# Patient Record
Sex: Female | Born: 1990 | Race: White | Hispanic: Yes | Smoking: Never smoker
Health system: Southern US, Community
[De-identification: ages and names within clinical notes are randomized; demographics above are authoritative.]

## PROBLEM LIST (undated history)

## (undated) DIAGNOSIS — Z789 Other specified health status: Secondary | ICD-10-CM

## (undated) HISTORY — DX: Other specified health status: Z78.9

## (undated) HISTORY — PX: GALLBLADDER SURGERY: SHX652

## (undated) HISTORY — PX: ANKLE SURGERY: SHX546

---

## 2008-10-20 ENCOUNTER — Emergency Department: Payer: Self-pay | Admitting: Emergency Medicine

## 2010-10-03 IMAGING — CT CT ABD-PELV W/ CM
1 of 2 series · 16 of 32 positions shown, 20 images · non-contrast
Comparison: none

REASON FOR EXAM: (1) mid abd pain; (2) same
COMMENTS:

[Series 2: appendicitis · axial · 0.68mm/px · z∈[-307,+119]mm · 16 of 156 slices shown, 20 images]
[im 7/156  soft-tissue]
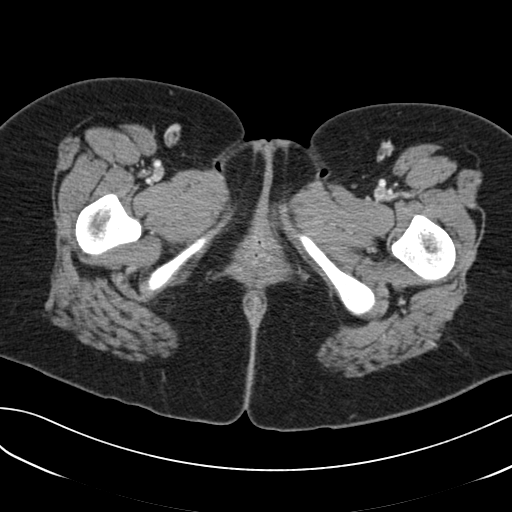
[im 7/156  bone]
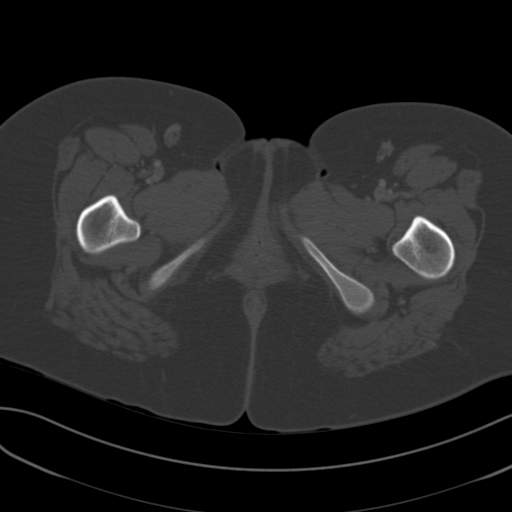
[im 19/156  soft-tissue]
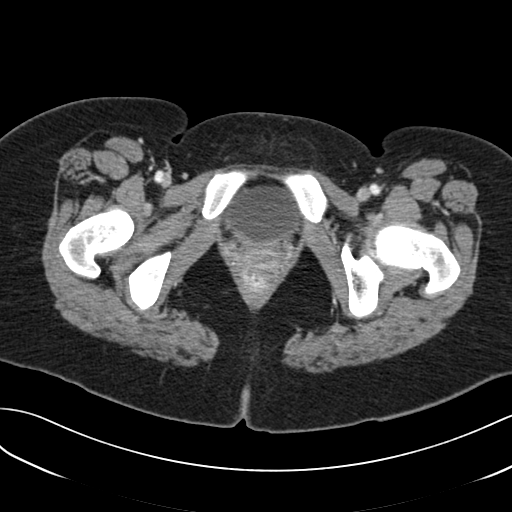
[im 32/156  soft-tissue]
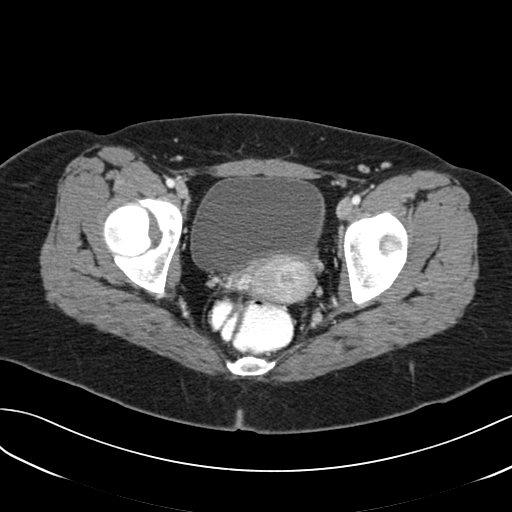
[im 44/156  soft-tissue]
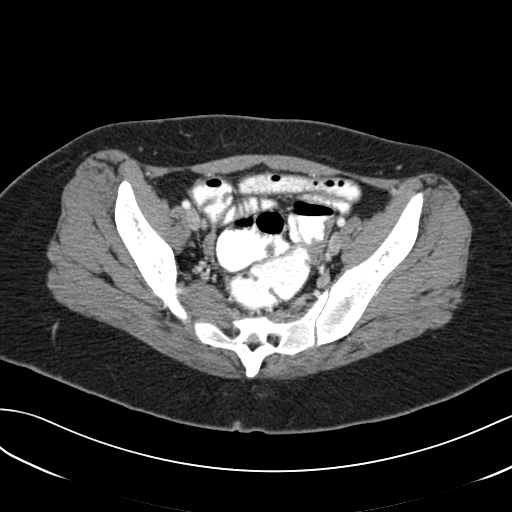
[im 50/156  soft-tissue]
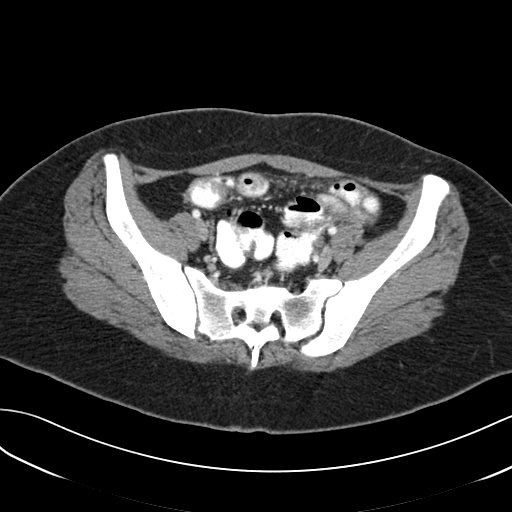
[im 63/156  soft-tissue]
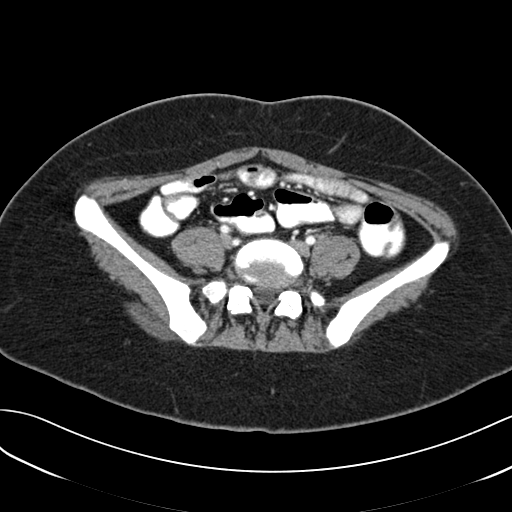
[im 75/156  soft-tissue]
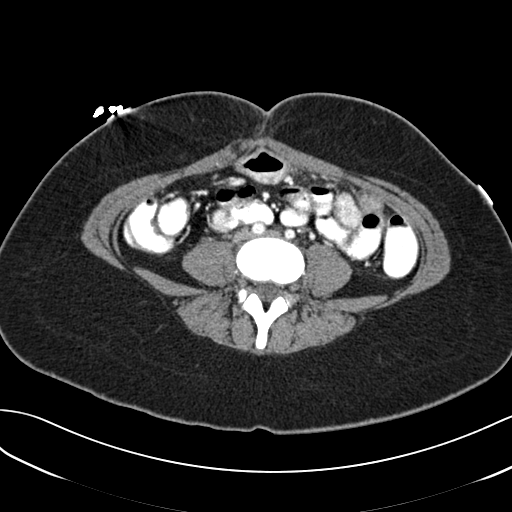
[im 81/156  soft-tissue]
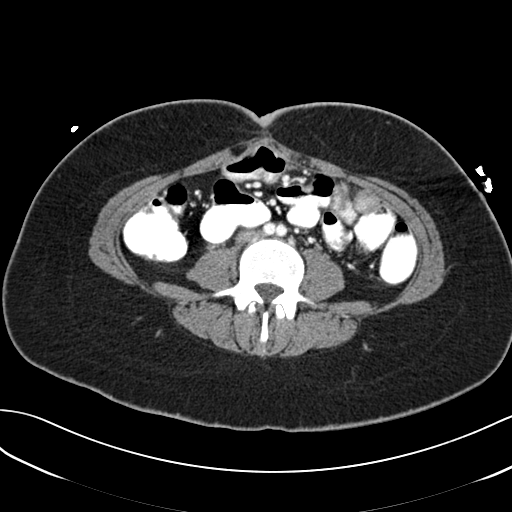
[im 94/156  soft-tissue]
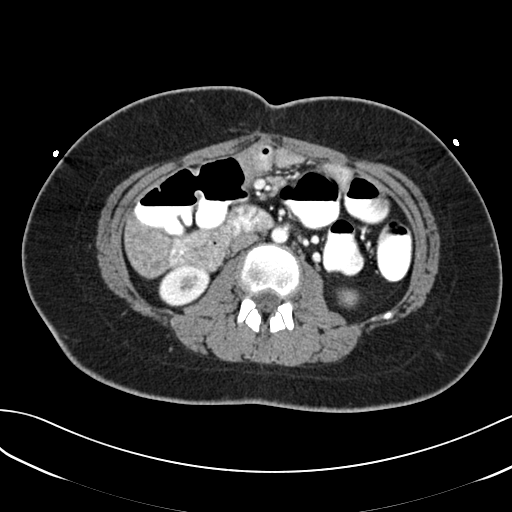
[im 94/156  bone]
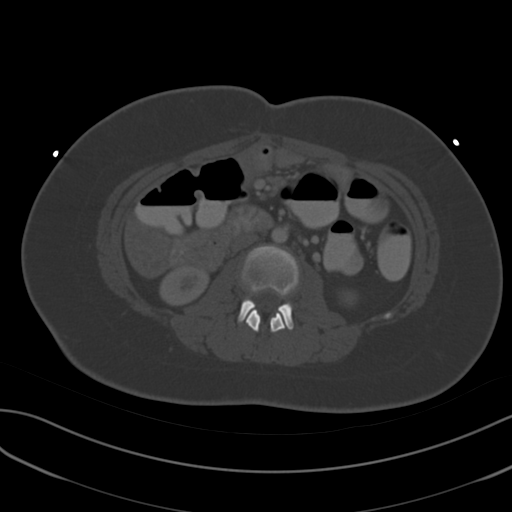
[im 106/156  soft-tissue]
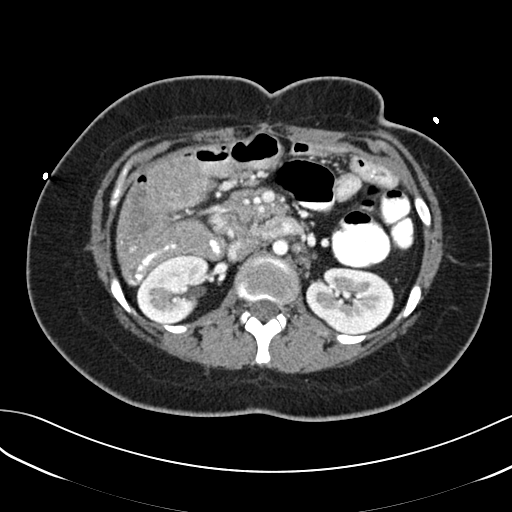
[im 118/156  soft-tissue]
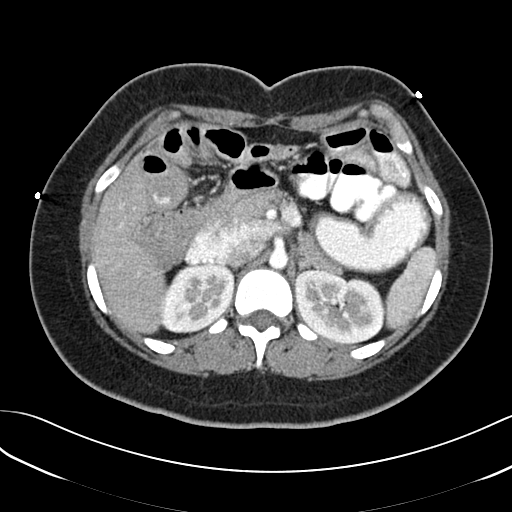
[im 125/156  soft-tissue]
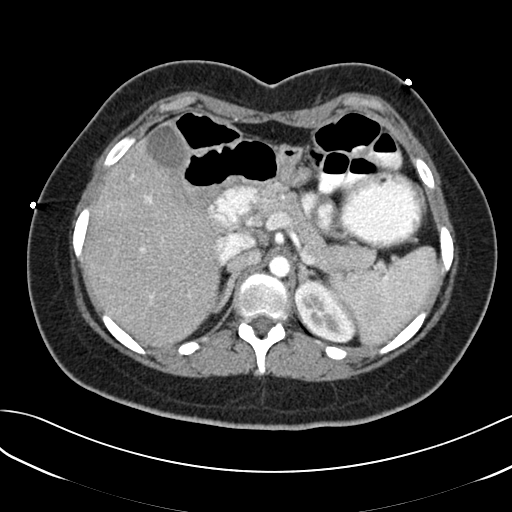
[im 131/156  lung]
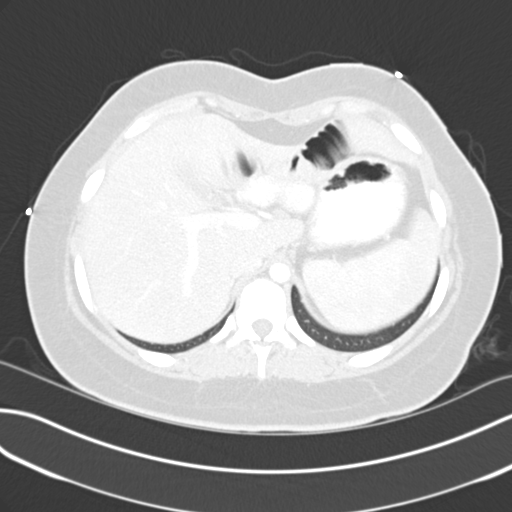
[im 137/156  soft-tissue]
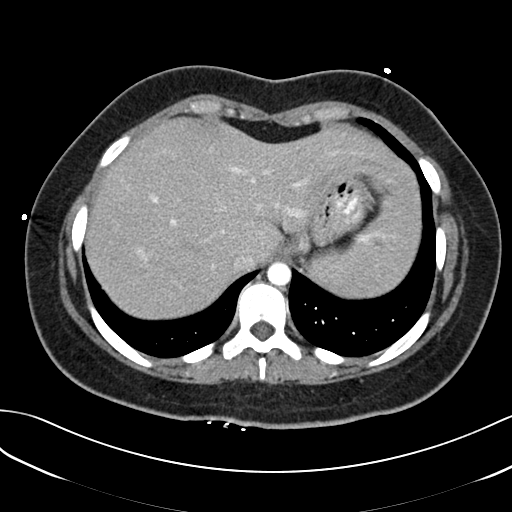
[im 137/156  lung]
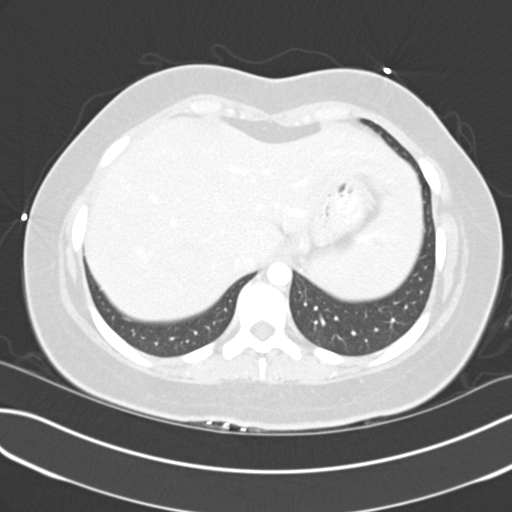
[im 143/156  lung]
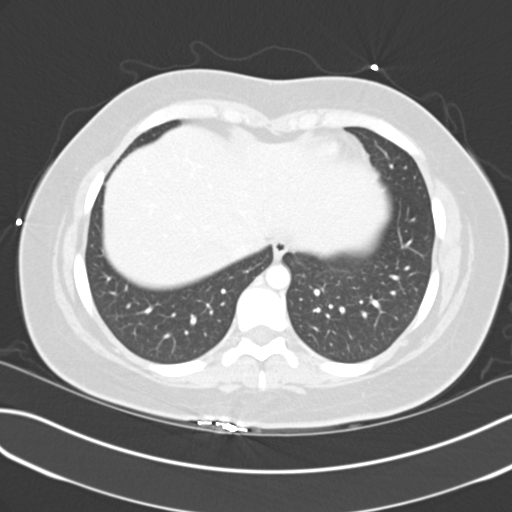
[im 149/156  soft-tissue]
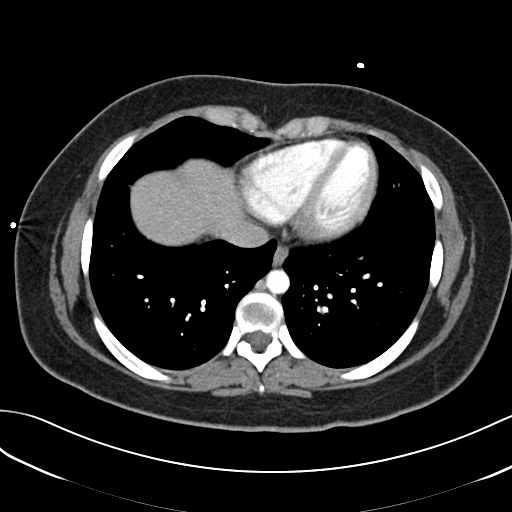
[im 149/156  lung]
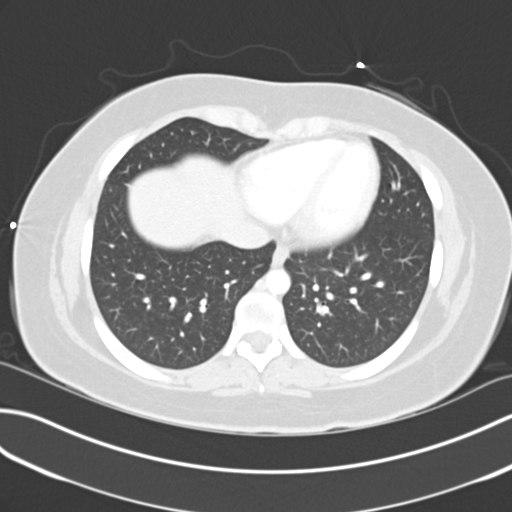

[16 of 32 positions shown; findings below may reference images not displayed]

PROCEDURE:     CT  - CT ABDOMEN / PELVIS  W  - October 20, 2008 [DATE]

RESULT:     History: Abdominal pain.

COMPARISON STUDIES:  No prior.

PROCEDURE AND FINDINGS:  Following administration of 100 ml of Bsovue-LI0,
CT of the abdomen and pelvis is obtained.

The liver is normal. The spleen is normal. The pancreas is normal. The
adrenals are normal. The kidneys are normal. Small bowel loops are noted to
the right of the ascending colon. An internal hernia could present in this
fashion. No proximal bowel distention is noted. The colon is well distended.
The aorta is normal. Mesenteric vasculature is normal. The right lower
quadrant is unremarkable. There is no free air.
IMPRESSION: Small bowel loops are noted in the right upper quadrant
lateral to the ascending colon. An internal hernia can present in this
fashion. No bowel obstruction or acute abnormality.

## 2020-05-25 ENCOUNTER — Ambulatory Visit: Payer: Self-pay

## 2020-10-18 ENCOUNTER — Ambulatory Visit: Payer: Medicaid Other

## 2020-10-19 ENCOUNTER — Ambulatory Visit: Payer: Medicaid Other

## 2020-12-28 ENCOUNTER — Ambulatory Visit: Payer: Medicaid Other | Admitting: Nurse Practitioner

## 2020-12-28 ENCOUNTER — Other Ambulatory Visit: Payer: Self-pay

## 2020-12-28 ENCOUNTER — Encounter: Payer: Self-pay | Admitting: Nurse Practitioner

## 2020-12-28 VITALS — BP 122/76 | HR 66 | Temp 98.3°F | Ht 64.0 in | Wt 207.0 lb

## 2020-12-28 DIAGNOSIS — Z13220 Encounter for screening for lipoid disorders: Secondary | ICD-10-CM | POA: Diagnosis not present

## 2020-12-28 DIAGNOSIS — L659 Nonscarring hair loss, unspecified: Secondary | ICD-10-CM | POA: Diagnosis not present

## 2020-12-28 DIAGNOSIS — Z7689 Persons encountering health services in other specified circumstances: Secondary | ICD-10-CM

## 2020-12-28 DIAGNOSIS — R5383 Other fatigue: Secondary | ICD-10-CM

## 2020-12-28 DIAGNOSIS — R4589 Other symptoms and signs involving emotional state: Secondary | ICD-10-CM

## 2020-12-28 DIAGNOSIS — Z136 Encounter for screening for cardiovascular disorders: Secondary | ICD-10-CM

## 2020-12-28 DIAGNOSIS — Z3009 Encounter for other general counseling and advice on contraception: Secondary | ICD-10-CM

## 2020-12-28 DIAGNOSIS — G4700 Insomnia, unspecified: Secondary | ICD-10-CM

## 2020-12-28 NOTE — Patient Instructions (Signed)
Can take melatonin 3-5mg  OTC 30-45 minutes before bed to help you sleep

## 2020-12-28 NOTE — Progress Notes (Signed)
New Patient Office Visit  Subjective:  Patient ID: Kelly Hester, female    DOB: 11-29-1990  Age: 30 y.o. MRN: [id number]  CC:  Chief Complaint  Patient presents with   Establish Care    Not sleeping and losing hair. She noticed about a month ago that her hair was starting to fall out, and hasn't been sleeping for 2 weeks.    HPI Kelly Hester presents for new patient visit to establish care.  Introduced to Publishing rights manager role and practice setting.  All questions answered.  Discussed provider/patient relationship and expectations. The visit was completed with use of an ASL interpreter.   She states that for the past 2 weeks she has been having trouble falling asleep. Some nights she can't fall asleep until 4am. She denies drinking any caffeine and has not tried anything over the counter. She states that her she has been feeling somewhat depressed the past few days since she wasn't able to see her children on Friday. She has 5 children who are in foster care and she is able to visit them every 2 weeks. She also has noticed that her hair has been starting to fall out over the past month. She does not take any medications or OTC supplements . Depression screen PHQ 2/9 12/28/2020  Decreased Interest 2  Down, Depressed, Hopeless 1  PHQ - 2 Score 3  Altered sleeping 2  Tired, decreased energy 2  Change in appetite 1  Feeling bad or failure about yourself  1  Trouble concentrating 0  Moving slowly or fidgety/restless 1  Suicidal thoughts 0  PHQ-9 Score 10  Difficult doing work/chores Not difficult at all   GAD 7 : Generalized Anxiety Score 12/28/2020  Nervous, Anxious, on Edge 1  Control/stop worrying 1  Worry too much - different things 1  Trouble relaxing 1  Restless 1  Easily annoyed or irritable 1  Afraid - awful might happen 2  Total GAD 7 Score 8  Anxiety Difficulty Not difficult at all    History reviewed. No pertinent past medical history.  Past Surgical  History:  Procedure Laterality Date   ANKLE SURGERY Left     History reviewed. No pertinent family history.  Social History   Socioeconomic History   Marital status: Divorced    Spouse name: Not on file   Number of children: Not on file   Years of education: Not on file   Highest education level: Not on file  Occupational History   Not on file  Tobacco Use   Smoking status: Never   Smokeless tobacco: Never  Vaping Use   Vaping Use: Every day  Substance and Sexual Activity   Alcohol use: Never   Drug use: Never   Sexual activity: Yes  Other Topics Concern   Not on file  Social History Narrative   Not on file   Social Determinants of Health   Financial Resource Strain: Not on file  Food Insecurity: Not on file  Transportation Needs: Not on file  Physical Activity: Not on file  Stress: Not on file  Social Connections: Not on file  Intimate Partner Violence: Not on file    ROS Review of Systems  Constitutional:  Positive for fatigue.  HENT:  Negative for congestion, ear pain and sore throat.        Deaf bilaterally  Eyes: Negative.   Respiratory: Negative.    Cardiovascular: Negative.   Gastrointestinal: Negative.   Genitourinary: Negative.   Musculoskeletal: Negative.  Skin: Negative.   Neurological: Negative.   Psychiatric/Behavioral:  Positive for dysphoric mood and sleep disturbance.    Objective:   Today's Vitals: BP 122/76   Pulse 66   Temp 98.3 F (36.8 C) (Oral)   Ht 5\' 4"  (1.626 m)   Wt 207 lb (93.9 kg)   LMP 11/22/2020 (Approximate)   SpO2 98%   BMI 35.53 kg/m   Physical Exam Vitals and nursing note reviewed.  Constitutional:      General: She is not in acute distress.    Appearance: Normal appearance.  HENT:     Head: Normocephalic.     Ears:     Comments: Deaf bilaterally Eyes:     Conjunctiva/sclera: Conjunctivae normal.  Cardiovascular:     Rate and Rhythm: Normal rate and regular rhythm.     Pulses: Normal pulses.      Heart sounds: Normal heart sounds.  Pulmonary:     Effort: Pulmonary effort is normal.     Breath sounds: Normal breath sounds.  Musculoskeletal:     Cervical back: Normal range of motion.  Skin:    General: Skin is warm.  Neurological:     General: No focal deficit present.     Mental Status: She is alert and oriented to person, place, and time.  Psychiatric:        Mood and Affect: Mood normal.        Behavior: Behavior normal.        Thought Content: Thought content normal.        Judgment: Judgment normal.    Assessment & Plan:   Problem List Items Addressed This Visit   None Visit Diagnoses     Fatigue, unspecified type    -  Primary   Discussed sleep hygiene. Will check CMP, CBC, and TSH today. If labs unrevealing, can consider sleep study   Relevant Orders   Comprehensive metabolic panel   CBC with Differential/Platelet   TSH   Hair loss       New x1 month. Will check labs as mentioned above   Relevant Orders   Comprehensive metabolic panel   CBC with Differential/Platelet   TSH   Encounter for lipid screening for cardiovascular disease       Baseline lipid panel today   Relevant Orders   Lipid Panel w/o Chol/HDL Ratio   Birth control counseling       Discussed birth control. She wants to have a "natural period." Non-hormonal options discussed. She would like GYN referral for Copper IUD   Relevant Orders   Ambulatory referral to Gynecology   Insomnia, unspecified type       Ongoing x2 weeks. Discussed good sleep hygiene. Can try melatonin OTC to see if it helps with symptoms. F/U 1-2 months   Encounter to establish care       Depressed mood       Symptoms for the last few days. She can exercise, improve her sleep. Will see if symptoms are still ongoing. F/U in 4-6 weeks or sooner if needed.        No outpatient encounter medications on file as of 12/28/2020.   No facility-administered encounter medications on file as of 12/28/2020.    Follow-up: Return  in about 4 weeks (around 01/25/2021) for 1-2 months for physical with pap not fasting.   01/27/2021, NP

## 2020-12-29 LAB — LIPID PANEL W/O CHOL/HDL RATIO
Cholesterol, Total: 153 mg/dL (ref 100–199)
HDL: 36 mg/dL — ABNORMAL LOW (ref >39)
LDL Chol Calc (NIH): 95 mg/dL (ref 0–99)
Triglycerides: 124 mg/dL (ref 0–149)
VLDL Cholesterol Cal: 22 mg/dL (ref 5–40)

## 2020-12-29 LAB — COMPREHENSIVE METABOLIC PANEL
ALT: 13 IU/L (ref 0–32)
AST: 15 IU/L (ref 0–40)
Albumin/Globulin Ratio: 1.6 (ref 1.2–2.2)
Albumin: 4.6 g/dL (ref 3.8–4.8)
Alkaline Phosphatase: 104 IU/L (ref 44–121)
BUN/Creatinine Ratio: 18 (ref 9–23)
BUN: 11 mg/dL (ref 6–20)
Bilirubin Total: 0.4 mg/dL (ref 0.0–1.2)
CO2: 24 mmol/L (ref 20–29)
Calcium: 9.5 mg/dL (ref 8.7–10.2)
Chloride: 103 mmol/L (ref 96–106)
Creatinine, Ser: 0.62 mg/dL (ref 0.57–1.00)
Globulin, Total: 2.8 g/dL (ref 1.5–4.5)
Glucose: 85 mg/dL (ref 70–99)
Potassium: 4.1 mmol/L (ref 3.5–5.2)
Sodium: 140 mmol/L (ref 134–144)
Total Protein: 7.4 g/dL (ref 6.0–8.5)
eGFR: 121 mL/min/{1.73_m2} (ref >59)

## 2020-12-29 LAB — CBC WITH DIFFERENTIAL/PLATELET
Basophils Absolute: 0 10*3/uL (ref 0.0–0.2)
Basos: 1 %
EOS (ABSOLUTE): 0.1 10*3/uL (ref 0.0–0.4)
Eos: 2 %
Hematocrit: 36.6 % (ref 34.0–46.6)
Hemoglobin: 11.7 g/dL (ref 11.1–15.9)
Immature Grans (Abs): 0 10*3/uL (ref 0.0–0.1)
Immature Granulocytes: 0 %
Lymphocytes Absolute: 1.9 10*3/uL (ref 0.7–3.1)
Lymphs: 27 %
MCH: 25.3 pg — ABNORMAL LOW (ref 26.6–33.0)
MCHC: 32 g/dL (ref 31.5–35.7)
MCV: 79 fL (ref 79–97)
Monocytes Absolute: 0.4 10*3/uL (ref 0.1–0.9)
Monocytes: 5 %
Neutrophils Absolute: 4.7 10*3/uL (ref 1.4–7.0)
Neutrophils: 65 %
Platelets: 248 10*3/uL (ref 150–450)
RBC: 4.63 x10E6/uL (ref 3.77–5.28)
RDW: 16.7 % — ABNORMAL HIGH (ref 11.7–15.4)
WBC: 7.2 10*3/uL (ref 3.4–10.8)

## 2020-12-29 LAB — TSH: TSH: 0.774 u[IU]/mL (ref 0.450–4.500)

## 2021-01-27 NOTE — Progress Notes (Signed)
 BP 108/72   Pulse 73   Temp 98.7 F (37.1 C) (Oral)   Ht 5' 3.7" (1.618 m)   Wt 202 lb 6.4 oz (91.8 kg)   LMP 01/07/2021 (Exact Date)   SpO2 97%   BMI 35.07 kg/m    Subjective:    Patient ID: Kelly Hester, female    DOB: 05/12/1990, 30 y.o.   MRN: [id number]  CC: Chief Complaint  Patient presents with   Annual Exam   HPI: Kelly Hester is a 30 y.o. female presenting on 01/28/2021 for comprehensive medical examination. Current medical complaints include: missed period. She states that she gets her period every 4 weeks, and she had light spotting on Sunday, and then she stopped bleeding.  She currently lives with: mom Menopausal Symptoms: no  Depression Screen done today and results listed below:  Depression screen PHQ 2/9 01/28/2021 12/28/2020  Decreased Interest 1 2  Down, Depressed, Hopeless 1 1  PHQ - 2 Score 2 3  Altered sleeping 2 2  Tired, decreased energy 2 2  Change in appetite 1 1  Feeling bad or failure about yourself  0 1  Trouble concentrating 1 0  Moving slowly or fidgety/restless 0 1  Suicidal thoughts 0 0  PHQ-9 Score 8 10  Difficult doing work/chores Not difficult at all Not difficult at all   GAD 7 : Generalized Anxiety Score 01/28/2021 12/28/2020  Nervous, Anxious, on Edge 2 1  Control/stop worrying 2 1  Worry too much - different things 2 1  Trouble relaxing 2 1  Restless 2 1  Easily annoyed or irritable 2 1  Afraid - awful might happen 2 2  Total GAD 7 Score 14 8  Anxiety Difficulty Not difficult at all Not difficult at all    The patient does not have a history of falls. I did not complete a risk assessment for falls. A plan of care for falls was not documented.   Past Medical History:  History reviewed. No pertinent past medical history.  Surgical History:  Past Surgical History:  Procedure Laterality Date   ANKLE SURGERY Left     Medications:  No current outpatient medications on file prior to visit.   No current  facility-administered medications on file prior to visit.    Allergies:  No Known Allergies  Social History:  Social History   Socioeconomic History   Marital status: Divorced    Spouse name: Not on file   Number of children: Not on file   Years of education: Not on file   Highest education level: Not on file  Occupational History   Not on file  Tobacco Use   Smoking status: Never   Smokeless tobacco: Never  Vaping Use   Vaping Use: Every day  Substance and Sexual Activity   Alcohol use: Never   Drug use: Never   Sexual activity: Yes  Other Topics Concern   Not on file  Social History Narrative   Not on file   Social Determinants of Health   Financial Resource Strain: Not on file  Food Insecurity: Not on file  Transportation Needs: Not on file  Physical Activity: Not on file  Stress: Not on file  Social Connections: Not on file  Intimate Partner Violence: Not on file   Social History   Tobacco Use  Smoking Status Never  Smokeless Tobacco Never   Social History   Substance and Sexual Activity  Alcohol Use Never    Family History:  History reviewed.   No pertinent family history.  Past medical history, surgical history, medications, allergies, family history and social history reviewed with patient today and changes made to appropriate areas of the chart.   Review of Systems  Constitutional:  Positive for malaise/fatigue.  HENT: Negative.    Eyes: Negative.   Respiratory: Negative.    Cardiovascular: Negative.   Gastrointestinal: Negative.   Genitourinary:  Positive for frequency and urgency. Negative for dysuria.  Musculoskeletal:  Positive for back pain.  Skin: Negative.   Neurological: Negative.   Psychiatric/Behavioral:  Positive for depression.   All other ROS negative except what is listed above and in the HPI.      Objective:    BP 108/72   Pulse 73   Temp 98.7 F (37.1 C) (Oral)   Ht 5' 3.7" (1.618 m)   Wt 202 lb 6.4 oz (91.8 kg)    LMP 01/07/2021 (Exact Date)   SpO2 97%   BMI 35.07 kg/m   Wt Readings from Last 3 Encounters:  01/28/21 202 lb 6.4 oz (91.8 kg)  12/28/20 207 lb (93.9 kg)    Physical Exam Vitals and nursing note reviewed.  Constitutional:      General: She is not in acute distress.    Appearance: Normal appearance.  HENT:     Head: Normocephalic and atraumatic.     Right Ear: Tympanic membrane, ear canal and external ear normal.     Left Ear: Tympanic membrane, ear canal and external ear normal.     Nose: Nose normal.     Mouth/Throat:     Mouth: Mucous membranes are moist.     Pharynx: Oropharynx is clear.  Eyes:     Conjunctiva/sclera: Conjunctivae normal.  Cardiovascular:     Rate and Rhythm: Normal rate and regular rhythm.     Pulses: Normal pulses.     Heart sounds: Normal heart sounds.  Pulmonary:     Effort: Pulmonary effort is normal.     Breath sounds: Normal breath sounds.  Abdominal:     General: Bowel sounds are normal.     Palpations: Abdomen is soft.     Tenderness: There is no abdominal tenderness.  Musculoskeletal:        General: Normal range of motion.     Cervical back: Normal range of motion and neck supple. No tenderness.  Lymphadenopathy:     Cervical: No cervical adenopathy.  Skin:    General: Skin is warm and dry.  Neurological:     General: No focal deficit present.     Mental Status: She is alert and oriented to person, place, and time.     Cranial Nerves: No cranial nerve deficit.     Coordination: Coordination normal.     Gait: Gait normal.  Psychiatric:        Mood and Affect: Mood normal.        Behavior: Behavior normal.        Thought Content: Thought content normal.        Judgment: Judgment normal.    Results for orders placed or performed in visit on 12/28/20  Comprehensive metabolic panel  Result Value Ref Range   Glucose 85 70 - 99 mg/dL   BUN 11 6 - 20 mg/dL   Creatinine, Ser 0.62 0.57 - 1.00 mg/dL   eGFR 121 >59 mL/min/1.73    BUN/Creatinine Ratio 18 9 - 23   Sodium 140 134 - 144 mmol/L   Potassium 4.1 3.5 - 5.2 mmol/L   Chloride  103 96 - 106 mmol/L   CO2 24 20 - 29 mmol/L   Calcium 9.5 8.7 - 10.2 mg/dL   Total Protein 7.4 6.0 - 8.5 g/dL   Albumin 4.6 3.8 - 4.8 g/dL   Globulin, Total 2.8 1.5 - 4.5 g/dL   Albumin/Globulin Ratio 1.6 1.2 - 2.2   Bilirubin Total 0.4 0.0 - 1.2 mg/dL   Alkaline Phosphatase 104 44 - 121 IU/L   AST 15 0 - 40 IU/L   ALT 13 0 - 32 IU/L  CBC with Differential/Platelet  Result Value Ref Range   WBC 7.2 3.4 - 10.8 x10E3/uL   RBC 4.63 3.77 - 5.28 x10E6/uL   Hemoglobin 11.7 11.1 - 15.9 g/dL   Hematocrit 36.6 34.0 - 46.6 %   MCV 79 79 - 97 fL   MCH 25.3 (L) 26.6 - 33.0 pg   MCHC 32.0 31.5 - 35.7 g/dL   RDW 16.7 (H) 11.7 - 15.4 %   Platelets 248 150 - 450 x10E3/uL   Neutrophils 65 Not Estab. %   Lymphs 27 Not Estab. %   Monocytes 5 Not Estab. %   Eos 2 Not Estab. %   Basos 1 Not Estab. %   Neutrophils Absolute 4.7 1.4 - 7.0 x10E3/uL   Lymphocytes Absolute 1.9 0.7 - 3.1 x10E3/uL   Monocytes Absolute 0.4 0.1 - 0.9 x10E3/uL   EOS (ABSOLUTE) 0.1 0.0 - 0.4 x10E3/uL   Basophils Absolute 0.0 0.0 - 0.2 x10E3/uL   Immature Granulocytes 0 Not Estab. %   Immature Grans (Abs) 0.0 0.0 - 0.1 x10E3/uL  Lipid Panel w/o Chol/HDL Ratio  Result Value Ref Range   Cholesterol, Total 153 100 - 199 mg/dL   Triglycerides 124 0 - 149 mg/dL   HDL 36 (L) >39 mg/dL   VLDL Cholesterol Cal 22 5 - 40 mg/dL   LDL Chol Calc (NIH) 95 0 - 99 mg/dL  TSH  Result Value Ref Range   TSH 0.774 0.450 - 4.500 uIU/mL      Assessment & Plan:   Problem List Items Addressed This Visit   None Visit Diagnoses     Routine general medical examination at a health care facility    -  Primary   Health maintenance reviewed. Flu shot and pap today. Labs reviewed from last visit. Follow up in 1 year.    Relevant Orders   Cytology - PAP   Missed period       Urine pregnancy negative today, although it may be slightly  early for testing. If still doesn't have period in 2 weeks, she can take a home test.    Relevant Orders   Pregnancy, urine   Urinary frequency       U/A negative.    Relevant Orders   Urinalysis, Routine w reflex microscopic        Follow up plan: Return in about 1 year (around 01/28/2022) for physical with Karen.   LABORATORY TESTING:  - Pap smear: done elsewhere  IMMUNIZATIONS:   - Tdap: Tetanus vaccination status reviewed: last tetanus booster within 10 years. - Influenza: Given elsewhere - Pneumovax: Not applicable - Prevnar: Not applicable - HPV: Given elsewhere - Zostavax vaccine: Not applicable  SCREENING: -Mammogram: Not applicable  - Colonoscopy: Not applicable  - Bone Density: Not applicable  -Hearing Test: Not applicable  -Spirometry: Not applicable   PATIENT COUNSELING:   Advised to take 1 mg of folate supplement per day if capable of pregnancy.   Sexuality: Discussed sexually transmitted   diseases, partner selection, use of condoms, avoidance of unintended pregnancy  and contraceptive alternatives.   Advised to avoid cigarette smoking.  I discussed with the patient that most people either abstain from alcohol or drink within safe limits (<=14/week and <=4 drinks/occasion for males, <=7/weeks and <= 3 drinks/occasion for females) and that the risk for alcohol disorders and other health effects rises proportionally with the number of drinks per week and how often a drinker exceeds daily limits.  Discussed cessation/primary prevention of drug use and availability of treatment for abuse.   Diet: Encouraged to adjust caloric intake to maintain  or achieve ideal body weight, to reduce intake of dietary saturated fat and total fat, to limit sodium intake by avoiding high sodium foods and not adding table salt, and to maintain adequate dietary potassium and calcium preferably from fresh fruits, vegetables, and low-fat dairy products.    stressed the importance of  regular exercise  Injury prevention: Discussed safety belts, safety helmets, smoke detector, smoking near bedding or upholstery.   Dental health: Discussed importance of regular tooth brushing, flossing, and dental visits.    NEXT PREVENTATIVE PHYSICAL DUE IN 1 YEAR. Return in about 1 year (around 01/28/2022) for physical with Karen.           

## 2021-01-28 ENCOUNTER — Encounter: Payer: Self-pay | Admitting: Nurse Practitioner

## 2021-01-28 ENCOUNTER — Ambulatory Visit (INDEPENDENT_AMBULATORY_CARE_PROVIDER_SITE_OTHER): Payer: Medicaid Other | Admitting: Nurse Practitioner

## 2021-01-28 ENCOUNTER — Other Ambulatory Visit (HOSPITAL_COMMUNITY)
Admission: RE | Admit: 2021-01-28 | Discharge: 2021-01-28 | Disposition: A | Discharge status: Home / Self Care | Payer: Medicaid Other | Source: Ambulatory Visit | Attending: Nurse Practitioner | Admitting: Nurse Practitioner

## 2021-01-28 ENCOUNTER — Other Ambulatory Visit: Payer: Self-pay

## 2021-01-28 VITALS — BP 108/72 | HR 73 | Temp 98.7°F | Ht 63.7 in | Wt 202.4 lb

## 2021-01-28 DIAGNOSIS — Z0000 Encounter for general adult medical examination without abnormal findings: Secondary | ICD-10-CM | POA: Diagnosis present

## 2021-01-28 DIAGNOSIS — N926 Irregular menstruation, unspecified: Secondary | ICD-10-CM

## 2021-01-28 DIAGNOSIS — R350 Frequency of micturition: Secondary | ICD-10-CM | POA: Diagnosis not present

## 2021-01-28 DIAGNOSIS — Z23 Encounter for immunization: Secondary | ICD-10-CM | POA: Diagnosis not present

## 2021-01-28 LAB — URINALYSIS, ROUTINE W REFLEX MICROSCOPIC
Bilirubin, UA: NEGATIVE
Glucose, UA: NEGATIVE
Ketones, UA: NEGATIVE
Leukocytes,UA: NEGATIVE
Nitrite, UA: NEGATIVE
Protein,UA: NEGATIVE
RBC, UA: NEGATIVE
Specific Gravity, UA: 1.02 (ref 1.005–1.030)
Urobilinogen, Ur: 0.2 mg/dL (ref 0.2–1.0)
pH, UA: 7 (ref 5.0–7.5)

## 2021-01-28 LAB — PREGNANCY, URINE: Preg Test, Ur: NEGATIVE

## 2021-01-28 NOTE — Addendum Note (Signed)
Addended by: Pablo Ledger on: 01/28/2021 10:01 AM   Modules accepted: Orders

## 2021-02-02 LAB — CYTOLOGY - PAP
Comment: NEGATIVE
Diagnosis: NEGATIVE
High risk HPV: NEGATIVE

## 2021-03-16 ENCOUNTER — Other Ambulatory Visit: Payer: Self-pay

## 2021-03-16 ENCOUNTER — Ambulatory Visit (INDEPENDENT_AMBULATORY_CARE_PROVIDER_SITE_OTHER): Payer: Medicaid Other | Admitting: Obstetrics and Gynecology

## 2021-03-16 ENCOUNTER — Encounter: Payer: Self-pay | Admitting: Obstetrics and Gynecology

## 2021-03-16 VITALS — BP 127/65 | HR 73 | Ht 64.0 in | Wt 205.0 lb

## 2021-03-16 DIAGNOSIS — Z3009 Encounter for other general counseling and advice on contraception: Secondary | ICD-10-CM

## 2021-03-16 NOTE — Patient Instructions (Signed)

## 2021-03-16 NOTE — Progress Notes (Signed)
° ° °  GYNECOLOGY CLINIC PROGRESS NOTE Subjective:    Kelly Hester is a 31 y.o. [id number] female who presents for contraception counseling. She is accompanied by an ASL interpreter as she is hearing impaired. The patient has no complaints today. The patient is sexually active. Pertinent past medical history: none.  Last pap smear was 01/2021, performed by her PCP.  Patient notes that she was considering an IUD.   Menstrual History: OB History     Gravida  8   Para  5   Term  5   Preterm      AB  3   Living         SAB  3   IAB      Ectopic      Multiple      Live Births              Menarche age: 4 Patient's last menstrual period was 03/11/2021 (exact date). Period Cycle (Days): 28 Period Duration (Days): 6 Period Pattern: Regular Menstrual Flow: Moderate Menstrual Control: Tampon Menstrual Control Change Freq (Hours): 4 hours Dysmenorrhea: None  The following portions of the patient's history were reviewed and updated as appropriate: She  has a past medical history of No pertinent past medical history.  She  has a past surgical history that includes Ankle surgery (Left) and Gallbladder surgery.  Her family history includes Healthy in her father and mother.  She  reports that she has never smoked. She has never used smokeless tobacco. She reports that she does not drink alcohol and does not use drugs. She currently has no medications in their medication list.  No current outpatient medications on file prior to visit.   No current facility-administered medications on file prior to visit.   She has No Known Allergies..  Review of Systems A comprehensive review of systems was negative.   Objective:    BP 127/65    Pulse 73    Ht 5\' 4"  (1.626 m)    Wt 205 lb (93 kg)    LMP 03/11/2021 (Exact Date)    SpO2 98%    BMI 35.19 kg/m   General appearance: alert, no distress, and moderately obese Lungs: clear to auscultation bilaterally Heart: regular rate  and rhythm, S1, S2 normal, no murmur, click, rub or gallop Abdomen: soft, non-tender; bowel sounds normal; no masses,  no organomegaly Pelvic: deferred Extremities: extremities normal, atraumatic, no cyanosis or edema Skin: Skin color, texture, turgor normal. No rashes or lesions Neurologic: Grossly normal    Assessment:   Contraception counseling  Plan:   Reviewed all forms of birth control options available including abstinence; fertility period awareness methods; over the counter/barrier methods; hormonal contraceptive medication including pill, patch, ring, injection,contraceptive implant; hormonal and nonhormonal IUDs; permanent sterilization options including vasectomy and the various tubal sterilization modalities. Risks and benefits reviewed.  Questions were answered. Patient now leaning towards Nexplanon.  Information was given to patient to review. To return in 2 weeks for insertion.    A total of 30 minutes were spent face-to-face with the patient during this encounter and over half of that time dealt with counseling and coordination of care.    03/13/2021, MD [facility]

## 2021-03-30 ENCOUNTER — Ambulatory Visit (INDEPENDENT_AMBULATORY_CARE_PROVIDER_SITE_OTHER): Payer: Medicaid Other | Admitting: Obstetrics and Gynecology

## 2021-03-30 ENCOUNTER — Other Ambulatory Visit: Payer: Self-pay

## 2021-03-30 ENCOUNTER — Encounter: Payer: Self-pay | Admitting: Obstetrics and Gynecology

## 2021-03-30 VITALS — BP 116/76 | HR 76 | Ht 64.0 in | Wt 207.0 lb

## 2021-03-30 DIAGNOSIS — Z30017 Encounter for initial prescription of implantable subdermal contraceptive: Secondary | ICD-10-CM | POA: Diagnosis not present

## 2021-03-30 DIAGNOSIS — Z3046 Encounter for surveillance of implantable subdermal contraceptive: Secondary | ICD-10-CM | POA: Diagnosis not present

## 2021-03-30 LAB — POCT URINE PREGNANCY: Preg Test, Ur: NEGATIVE

## 2021-03-30 NOTE — Progress Notes (Signed)
° ° ° ° °  GYNECOLOGY OFFICE PROCEDURE NOTE  Kelly Hester is a 31 y.o. [id number] here for Nexplanon insertion.  Last pap smear was on 01/28/2021 and was normal.  No other gynecologic concerns.  Nexplanon Insertion Procedure Patient identified, informed consent performed, consent signed.   Patient does understand that irregular bleeding is a very common side effect of this medication. She was advised to have backup contraception for one week after placement. Pregnancy test in clinic today was negative.  Appropriate time out taken.  Patient's left arm was prepped and draped in the usual sterile fashion. The ruler used to measure and mark insertion area.  Patient was prepped with alcohol swab and then injected with 3 ml of 1% lidocaine.  She was prepped with betadine, Nexplanon removed from packaging,  Device confirmed in needle, then inserted full length of needle and withdrawn per handbook instructions. Nexplanon was able to palpated in the patient's arm; patient palpated the insert herself. There was minimal blood loss.  Patient insertion site covered with guaze and a pressure bandage to reduce any bruising.  The patient tolerated the procedure well and was given post procedure instructions.    Lot: [id number] Exp: 01/04/2023  Hildred Laser, MD [facility]

## 2021-04-22 ENCOUNTER — Ambulatory Visit (INDEPENDENT_AMBULATORY_CARE_PROVIDER_SITE_OTHER): Payer: Medicaid Other | Admitting: Obstetrics and Gynecology

## 2021-04-22 ENCOUNTER — Other Ambulatory Visit: Payer: Self-pay

## 2021-04-22 ENCOUNTER — Encounter: Payer: Self-pay | Admitting: Obstetrics and Gynecology

## 2021-04-22 VITALS — BP 124/57 | HR 64 | Ht 64.0 in | Wt 204.0 lb

## 2021-04-22 DIAGNOSIS — Z975 Presence of (intrauterine) contraceptive device: Secondary | ICD-10-CM

## 2021-04-22 NOTE — Progress Notes (Signed)
° ° °  GYNECOLOGY PROGRESS NOTE  Subjective:    Patient ID: Kelly Hester, female    DOB: 1990/12/25, 31 y.o.   MRN: [id number]  ASL Language Line Interpreter Corrie Dandy [phone]) used for today's visit.   HPI  Patient is a 31 y.o. [id number] female who presents for f/u after Nexplanon insertion. Nexplanon was inserted 3 weeks ago.  Denies any undesirable side effects.  Has questions about how her cycles will respond to the Nexplanon.   The following portions of the patient's history were reviewed and updated as appropriate: allergies, current medications, past family history, past medical history, past social history, past surgical history, and problem list.  Review of Systems Pertinent items noted in HPI and remainder of comprehensive ROS otherwise negative.   Objective:  Blood pressure (!) 124/57, pulse 64, height 5\' 4"  (1.626 m), weight 204 lb (92.5 kg), last menstrual period 03/11/2021, SpO2 100 %. Body mass index is 35.02 kg/m. General appearance: alert and no distress Extremities:  left arm with implant insertion site well healed, no erythema or tenderness.  Neurologic: Grossly normal   Assessment:   1. Nexplanon in place      Plan:   - Discussed expectation for future menstrual cycles with regards to Nexplanon being in place. Answered all patient's questions. Can f/u as needed.  Nexplanon can remain in place for up to 3 years, or sooner if pregnancy is desired.  To f/u with her PCP for routine preventative care.    03/13/2021, MD [facility]

## 2021-05-09 ENCOUNTER — Encounter: Payer: Self-pay | Admitting: Obstetrics and Gynecology

## 2021-05-27 ENCOUNTER — Encounter: Payer: Medicaid Other | Admitting: Obstetrics and Gynecology

## 2021-07-08 ENCOUNTER — Encounter: Payer: Self-pay | Admitting: Obstetrics and Gynecology

## 2021-07-12 ENCOUNTER — Encounter: Payer: Self-pay | Admitting: Obstetrics and Gynecology

## 2021-07-12 ENCOUNTER — Ambulatory Visit (INDEPENDENT_AMBULATORY_CARE_PROVIDER_SITE_OTHER): Payer: Medicaid Other | Admitting: Obstetrics and Gynecology

## 2021-07-12 VITALS — BP 116/66 | HR 62 | Resp 16 | Ht 64.0 in | Wt 198.6 lb

## 2021-07-12 DIAGNOSIS — Z30011 Encounter for initial prescription of contraceptive pills: Secondary | ICD-10-CM

## 2021-07-12 DIAGNOSIS — Z3046 Encounter for surveillance of implantable subdermal contraceptive: Secondary | ICD-10-CM | POA: Diagnosis not present

## 2021-07-12 MED ORDER — BALCOLTRA 0.1-20 MG-MCG(21) PO TABS: 1.0000 | ORAL_TABLET | Freq: Every day | ORAL | 3 refills | Status: DC

## 2021-07-12 NOTE — Progress Notes (Signed)
? ? ? ? ?  GYNECOLOGY OFFICE PROCEDURE NOTE ? ?Kelly Hester is a 31 y.o. [id number] here for Nexplanon removal.  Patient desires removal due to arm pain since insertion of the Nexplanon.  Last pap smear was on 01/28/2021 and was normal.  No other gynecologic concerns. ? ?Nexplanon Removal ?Patient identified, informed consent performed, consent signed.   Appropriate time out taken. Nexplanon site identified.  Area prepped in usual sterile fashon. One ml of 1% lidocaine was used to anesthetize the area at the distal end of the implant. A small stab incision was made right beside the implant on the distal portion.  The Nexplanon rod was grasped using hemostats and removed without difficulty.  There was minimal blood loss. There were no complications.  3 ml of 1% lidocaine was injected around the incision for post-procedure analgesia.  Steri-strips were applied over the small incision.  A pressure bandage was applied to reduce any bruising.  The patient tolerated the procedure well and was given post procedure instructions.  Patient is planning to use combined OCPs for contraception. Given sample of Balcoltra, and will send in prescription.  Can initiate pills today.  Patient is currently not sexually active.  Pills are for management of her menstrual cycles. ? ? ?Hildred Laser, MD ?[facility] ? ?  ?

## 2021-10-05 ENCOUNTER — Ambulatory Visit
Admission: EM | Admit: 2021-10-05 | Discharge: 2021-10-05 | Disposition: A | Discharge status: Home / Self Care | Payer: Medicaid Other | Attending: Physician Assistant | Admitting: Physician Assistant

## 2021-10-05 ENCOUNTER — Encounter: Payer: Self-pay | Admitting: Emergency Medicine

## 2021-10-05 DIAGNOSIS — B964 Proteus (mirabilis) (morganii) as the cause of diseases classified elsewhere: Secondary | ICD-10-CM | POA: Insufficient documentation

## 2021-10-05 DIAGNOSIS — Z792 Long term (current) use of antibiotics: Secondary | ICD-10-CM | POA: Insufficient documentation

## 2021-10-05 DIAGNOSIS — Z20822 Contact with and (suspected) exposure to covid-19: Secondary | ICD-10-CM | POA: Diagnosis not present

## 2021-10-05 DIAGNOSIS — J029 Acute pharyngitis, unspecified: Secondary | ICD-10-CM | POA: Diagnosis not present

## 2021-10-05 DIAGNOSIS — N3001 Acute cystitis with hematuria: Secondary | ICD-10-CM | POA: Diagnosis not present

## 2021-10-05 DIAGNOSIS — R509 Fever, unspecified: Secondary | ICD-10-CM | POA: Insufficient documentation

## 2021-10-05 LAB — RESP PANEL BY RT-PCR (FLU A&B, COVID) ARPGX2
Influenza A by PCR: NEGATIVE
Influenza B by PCR: NEGATIVE
SARS Coronavirus 2 by RT PCR: NEGATIVE

## 2021-10-05 LAB — URINALYSIS, ROUTINE W REFLEX MICROSCOPIC
Bilirubin Urine: NEGATIVE
Glucose, UA: NEGATIVE mg/dL
Ketones, ur: NEGATIVE mg/dL
Nitrite: NEGATIVE
Protein, ur: NEGATIVE mg/dL
Specific Gravity, Urine: 1.015 (ref 1.005–1.030)
pH: 7 (ref 5.0–8.0)

## 2021-10-05 LAB — GROUP A STREP BY PCR: Group A Strep by PCR: NOT DETECTED

## 2021-10-05 LAB — URINALYSIS, MICROSCOPIC (REFLEX)

## 2021-10-05 MED ORDER — ACETAMINOPHEN 500 MG PO TABS
1000.0000 mg | ORAL_TABLET | Freq: Once | ORAL | Status: AC
  Administered 2021-10-05: 1000 mg via ORAL

## 2021-10-05 MED ORDER — CIPROFLOXACIN HCL 500 MG PO TABS: 500.0000 mg | ORAL_TABLET | Freq: Two times a day (BID) | ORAL | 0 refills | Status: AC

## 2021-10-05 NOTE — Discharge Instructions (Signed)
-  Strep test is negative.  The flu and COVID tests are also negative. - You have a UTI.  This is likely causing her fever.  I sent antibiotics to pharmacy.  We will call you if the antibiotic needs to be changed based on the culture result. -Drink plenty of fluids. - Go to emergency department if you are not breaking the fever in the next couple days or if your pain worsens at all.

## 2021-10-05 NOTE — ED Provider Notes (Signed)
[facility]    CSN: [id number] Arrival date & time: 10/05/21  1516      History   Chief Complaint Chief Complaint  Patient presents with   Fever   Sore Throat   Chills    HPI Kelly Hester is a 31 y.o. female presenting for 2 to 3-day history of high fevers up to 102.8 degrees, fatigue, chills, sweats, sore throat and painful swallowing, dysuria/frequency and urgency as well as lower abdominal pain and pressure.  She denies cough, congestion, chest pain, breathing difficulty, flank pain.  Denies sick contacts.  Has not been taking any medication for symptoms.  ASL interpreter used.  HPI  Past Medical History:  Diagnosis Date   No pertinent past medical history     There are no problems to display for this patient.   Past Surgical History:  Procedure Laterality Date   ANKLE SURGERY Left    GALLBLADDER SURGERY      OB History     Gravida  8   Para  5   Term  5   Preterm      AB  3   Living         SAB  3   IAB      Ectopic      Multiple      Live Births               Home Medications    Prior to Admission medications   Medication Sig Start Date End Date Taking? Authorizing Provider  ciprofloxacin (CIPRO) 500 MG tablet Take 1 tablet (500 mg total) by mouth every 12 (twelve) hours for 7 days. 10/05/21 10/12/21 Yes Eusebio Friendly B, PA-C  Levonorgest-Eth Estrad-Fe Bisg (BALCOLTRA) 0.1-20 MG-MCG(21) TABS Take 1 tablet by mouth daily. 07/12/21   Hildred Laser, MD    Family History Family History  Problem Relation Age of Onset   Healthy Father    Healthy Mother    Cancer Neg Hx    Hypertension Neg Hx    Diabetes Neg Hx     Social History Social History   Tobacco Use   Smoking status: Never   Smokeless tobacco: Never  Vaping Use   Vaping Use: Former  Substance Use Topics   Alcohol use: Never   Drug use: Never     Allergies   Patient has no known allergies.   Review of Systems Review of Systems   Constitutional:  Positive for chills, diaphoresis and fever. Negative for fatigue.  HENT:  Positive for sore throat. Negative for congestion, ear pain, rhinorrhea, sinus pressure and sinus pain.   Respiratory:  Negative for cough and shortness of breath.   Cardiovascular:  Negative for chest pain.  Gastrointestinal:  Negative for abdominal pain, nausea and vomiting.  Genitourinary:  Positive for dysuria, frequency and urgency. Negative for flank pain, hematuria and vaginal discharge.  Musculoskeletal:  Negative for arthralgias and myalgias.  Skin:  Negative for rash.  Neurological:  Negative for weakness and headaches.  Hematological:  Negative for adenopathy.     Physical Exam Triage Vital Signs ED Triage Vitals  Enc Vitals Group     BP      Pulse      Resp      Temp      Temp src      SpO2      Weight      Height      Head Circumference      Peak Flow  Pain Score      Pain Loc      Pain Edu?      Excl. in GC?    No data found.  Updated Vital Signs BP 106/63 (BP Location: Left Arm)   Pulse 72   Temp 99.3 F (37.4 C) (Oral)   Resp 16   LMP 10/05/2021   SpO2 99%      Physical Exam Vitals and nursing note reviewed.  Constitutional:      General: She is not in acute distress.    Appearance: Normal appearance. She is ill-appearing and diaphoretic. She is not toxic-appearing.  HENT:     Head: Normocephalic and atraumatic.     Nose: Nose normal.     Mouth/Throat:     Mouth: Mucous membranes are moist.     Pharynx: Oropharynx is clear. Posterior oropharyngeal erythema present.  Eyes:     General: No scleral icterus.       Right eye: No discharge.        Left eye: No discharge.     Conjunctiva/sclera: Conjunctivae normal.  Cardiovascular:     Rate and Rhythm: Normal rate and regular rhythm.     Heart sounds: Normal heart sounds.  Pulmonary:     Effort: Pulmonary effort is normal. No respiratory distress.     Breath sounds: Normal breath sounds.   Abdominal:     Palpations: Abdomen is soft.     Tenderness: There is abdominal tenderness (suprapubic). There is no right CVA tenderness or left CVA tenderness.  Musculoskeletal:     Cervical back: Neck supple.  Neurological:     General: No focal deficit present.     Mental Status: She is alert. Mental status is at baseline.     Motor: No weakness.     Gait: Gait normal.  Psychiatric:        Mood and Affect: Mood normal.        Behavior: Behavior normal.        Thought Content: Thought content normal.      UC Treatments / Results  Labs (all labs ordered are listed, but only abnormal results are displayed) Labs Reviewed  URINALYSIS, ROUTINE W REFLEX MICROSCOPIC - Abnormal; Notable for the following components:      Result Value   APPearance HAZY (*)    Hgb urine dipstick TRACE (*)    Leukocytes,Ua TRACE (*)    All other components within normal limits  URINALYSIS, MICROSCOPIC (REFLEX) - Abnormal; Notable for the following components:   Bacteria, UA FEW (*)    Non Squamous Epithelial PRESENT (*)    All other components within normal limits  GROUP A STREP BY PCR  RESP PANEL BY RT-PCR (FLU A&B, COVID) [id number]  URINE CULTURE    EKG   Radiology No results found.  Procedures Procedures (including critical care time)  Medications Ordered in UC Medications  acetaminophen (TYLENOL) tablet 1,000 mg (1,000 mg Oral Given 10/05/21 1538)    Initial Impression / Assessment and Plan / UC Course  I have reviewed the triage vital signs and the nursing notes.  Pertinent labs & imaging results that were available during my care of the patient were reviewed by me and considered in my medical decision making (see chart for details).  31 year old female presenting for fever, sore throat, chills, sweats.  Also reporting dysuria/frequency and urgency x2 to 3 days.  Temp currently is 102.8 degrees.  Patient given 1 g Tylenol for fever.  On exam she is  a little diaphoretic and  ill-appearing but nontoxic.  Mild erythema posterior pharynx.  Abdomen soft with suprapubic tenderness.  No CVA tenderness.  PCR strep performed and negative. Respiratory panel negative for flu and COVID.  Urinalysis obtained reveals trace blood and leukocytes.  Bacteria seen on microscopic.  We will send urine for culture.  Suspect patient's urinary symptoms and fever are related to urinary tract infection.  Given her very elevated temperature of 102.8, will cover her for developing pyelonephritis.  Sent Cipro to pharmacy.  Advised patient we will contact her if we need to change the antibiotic.  She said someone will be available to take the call for her if needed.  ER precautions reviewed.   Final Clinical Impressions(s) / UC Diagnoses   Final diagnoses:  Acute cystitis with hematuria  Fever, unspecified fever cause  Sore throat     Discharge Instructions      -Strep test is negative.  The flu and COVID tests are also negative. - You have a UTI.  This is likely causing her fever.  I sent antibiotics to pharmacy.  We will call you if the antibiotic needs to be changed based on the culture result. -Drink plenty of fluids. - Go to emergency department if you are not breaking the fever in the next couple days or if your pain worsens at all.     ED Prescriptions     Medication Sig Dispense Auth. Provider   ciprofloxacin (CIPRO) 500 MG tablet Take 1 tablet (500 mg total) by mouth every 12 (twelve) hours for 7 days. 14 tablet Gareth Morgan      PDMP not reviewed this encounter.   Shirlee Latch, PA-C 10/05/21 1733

## 2021-10-05 NOTE — ED Triage Notes (Signed)
Pt presents with fever, ST and chills x 2 days. Pt is deaf used Hydrologist.

## 2021-10-06 ENCOUNTER — Encounter: Payer: Self-pay | Admitting: Nurse Practitioner

## 2021-10-06 ENCOUNTER — Ambulatory Visit (INDEPENDENT_AMBULATORY_CARE_PROVIDER_SITE_OTHER): Payer: Medicaid Other | Admitting: Nurse Practitioner

## 2021-10-06 VITALS — BP 119/75 | HR 92 | Temp 102.6°F | Wt 195.7 lb

## 2021-10-06 DIAGNOSIS — M542 Cervicalgia: Secondary | ICD-10-CM

## 2021-10-06 NOTE — Progress Notes (Signed)
BP 119/75   Pulse 92   Temp (!) 102.6 F (39.2 C) (Oral)   Wt 195 lb 11.2 oz (88.8 kg)   LMP 10/05/2021   SpO2 98%   BMI 33.59 kg/m    Subjective:    Patient ID: Kelly Hester, female    DOB: 04-Sep-1990, 31 y.o.   MRN: [id number]  HPI: Kelly Hester is a 31 y.o. female  Chief Complaint  Patient presents with   Neck Pain    Pain in neck with swelling, decreased ROM. Patient reports this has been going on x 1.5 weeks.    Dysuria    Burning with urination ongoing x 1 week.     Patient presents to clinic following UC visit yesterday where she was placed on Cipro for a UTI.  UA pos for trace leuks.    Patient is febrile in the office with complaints of neck pain and swelling.  She is having decreased ROM. The pain and swelling have been present for 1.5 weeks.  Relevant past medical, surgical, family and social history reviewed and updated as indicated. Interim medical history since our last visit reviewed. Allergies and medications reviewed and updated.  Review of Systems  HENT:         Swollen lymph nodes  Genitourinary:  Positive for dysuria.  Musculoskeletal:  Positive for neck pain.    Per HPI unless specifically indicated above     Objective:    BP 119/75   Pulse 92   Temp (!) 102.6 F (39.2 C) (Oral)   Wt 195 lb 11.2 oz (88.8 kg)   LMP 10/05/2021   SpO2 98%   BMI 33.59 kg/m   Wt Readings from Last 3 Encounters:  10/06/21 195 lb 11.2 oz (88.8 kg)  07/12/21 198 lb 9.6 oz (90.1 kg)  04/22/21 204 lb (92.5 kg)    Physical Exam Vitals and nursing note reviewed.  Constitutional:      General: She is not in acute distress.    Appearance: Normal appearance. She is normal weight. She is not ill-appearing, toxic-appearing or diaphoretic.  HENT:     Head: Normocephalic.     Right Ear: External ear normal.     Left Ear: External ear normal.     Nose: Nose normal.     Mouth/Throat:     Mouth: Mucous membranes are moist.     Pharynx: Oropharynx is  clear.  Eyes:     General:        Right eye: No discharge.        Left eye: No discharge.     Extraocular Movements: Extraocular movements intact.     Conjunctiva/sclera: Conjunctivae normal.     Pupils: Pupils are equal, round, and reactive to light.  Cardiovascular:     Rate and Rhythm: Normal rate and regular rhythm.     Heart sounds: No murmur heard. Pulmonary:     Effort: Pulmonary effort is normal. No respiratory distress.     Breath sounds: Normal breath sounds. No wheezing or rales.  Musculoskeletal:     Cervical back: Neck supple. Pain with movement and muscular tenderness present. Decreased range of motion.  Lymphadenopathy:     Cervical: Cervical adenopathy present.     Right cervical: Superficial cervical adenopathy and posterior cervical adenopathy present.     Left cervical: Superficial cervical adenopathy and posterior cervical adenopathy present.  Skin:    General: Skin is warm and dry.     Capillary Refill: Capillary refill takes less  than 2 seconds.  Neurological:     General: No focal deficit present.     Mental Status: She is alert and oriented to person, place, and time. Mental status is at baseline.  Psychiatric:        Mood and Affect: Mood normal.        Behavior: Behavior normal.        Thought Content: Thought content normal.        Judgment: Judgment normal.     Results for orders placed or performed during the hospital encounter of 10/05/21  Group A Strep by PCR   Specimen: Throat; Sterile Swab  Result Value Ref Range   Group A Strep by PCR NOT DETECTED NOT DETECTED  Resp Panel by RT-PCR (Flu A&B, Covid) Anterior Nasal Swab   Specimen: Anterior Nasal Swab  Result Value Ref Range   SARS Coronavirus 2 by RT PCR NEGATIVE NEGATIVE   Influenza A by PCR NEGATIVE NEGATIVE   Influenza B by PCR NEGATIVE NEGATIVE  Urine Culture   Specimen: Urine, Clean Catch  Result Value Ref Range   Specimen Description      URINE, CLEAN CATCH Performed at [facility], [address], [place], [place] [address]    Special Requests      NONE Performed at [facility], [address] [place], [place] [address]    Culture (A)     50,000 COLONIES/mL GRAM NEGATIVE RODS IDENTIFICATION AND SUSCEPTIBILITIES TO FOLLOW Performed at [facility], [address], [place], [place] [address]    Report Status PENDING   Urinalysis, Routine w reflex microscopic  Result Value Ref Range   Color, Urine YELLOW YELLOW   APPearance HAZY (A) CLEAR   Specific Gravity, Urine 1.015 1.005 - 1.030   pH 7.0 5.0 - 8.0   Glucose, UA NEGATIVE NEGATIVE mg/dL   Hgb urine dipstick TRACE (A) NEGATIVE   Bilirubin Urine NEGATIVE NEGATIVE   Ketones, ur NEGATIVE NEGATIVE mg/dL   Protein, ur NEGATIVE NEGATIVE mg/dL   Nitrite NEGATIVE NEGATIVE   Leukocytes,Ua TRACE (A) NEGATIVE  Urinalysis, Microscopic (reflex)  Result Value Ref Range   RBC / HPF 0-5 0 - 5 RBC/hpf   WBC, UA 11-20 0 - 5 WBC/hpf   Bacteria, UA FEW (A) NONE SEEN   Squamous Epithelial / LPF 0-5 0 - 5   Non Squamous Epithelial PRESENT (A) NONE SEEN   Mucus PRESENT       Assessment & Plan:   Problem List Items Addressed This Visit   None Visit Diagnoses     Neck pain    -  Primary   Due to patient's symptoms and concern for meningitis will recommend that she be seen in the ER.  Follow up after ER visit.        Follow up plan: Return if symptoms worsen or fail to improve.

## 2021-10-07 LAB — URINE CULTURE: Culture: 50000 — AB

## 2021-10-12 ENCOUNTER — Encounter: Payer: Medicaid Other | Admitting: Obstetrics and Gynecology

## 2021-10-12 NOTE — Progress Notes (Deleted)
    GYNECOLOGY PROGRESS NOTE  Subjective:    Patient ID: Kelly Hester, female    DOB: 09/08/1990, 31 y.o.   MRN: [id number]  HPI  Patient is a 31 y.o. [id number] female who presents for contraceptive management.  {Common ambulatory SmartLinks:19316}  Review of Systems {ros; complete:30496}   Objective:   Last menstrual period 10/05/2021. There is no height or weight on file to calculate BMI. General appearance: {general exam:16600} Abdomen: {abdominal exam:16834} Pelvic: {pelvic exam:16852::"cervix normal in appearance","external genitalia normal","no adnexal masses or tenderness","no cervical motion tenderness","rectovaginal septum normal","uterus normal size, shape, and consistency","vagina normal without discharge"} Extremities: {extremity exam:5109} Neurologic: {neuro exam:17854}   Assessment:   No diagnosis found.   Plan:   There are no diagnoses linked to this encounter.   Hildred Laser, MD [facility]

## 2021-11-30 ENCOUNTER — Encounter: Payer: Self-pay | Admitting: Nurse Practitioner

## 2021-12-01 ENCOUNTER — Encounter: Payer: Self-pay | Admitting: Nurse Practitioner

## 2021-12-01 ENCOUNTER — Ambulatory Visit: Payer: Medicaid Other | Admitting: Nurse Practitioner

## 2021-12-01 VITALS — BP 115/66 | HR 60 | Temp 98.9°F | Wt 190.1 lb

## 2021-12-01 DIAGNOSIS — N3000 Acute cystitis without hematuria: Secondary | ICD-10-CM

## 2021-12-01 DIAGNOSIS — M546 Pain in thoracic spine: Secondary | ICD-10-CM | POA: Diagnosis not present

## 2021-12-01 DIAGNOSIS — R350 Frequency of micturition: Secondary | ICD-10-CM | POA: Diagnosis not present

## 2021-12-01 DIAGNOSIS — Z23 Encounter for immunization: Secondary | ICD-10-CM | POA: Diagnosis not present

## 2021-12-01 DIAGNOSIS — N898 Other specified noninflammatory disorders of vagina: Secondary | ICD-10-CM

## 2021-12-01 LAB — URINALYSIS, ROUTINE W REFLEX MICROSCOPIC
Bilirubin, UA: NEGATIVE
Glucose, UA: NEGATIVE
Ketones, UA: NEGATIVE
Nitrite, UA: NEGATIVE
Protein,UA: NEGATIVE
RBC, UA: NEGATIVE
Specific Gravity, UA: 1.01 (ref 1.005–1.030)
Urobilinogen, Ur: 0.2 mg/dL (ref 0.2–1.0)
pH, UA: 7 (ref 5.0–7.5)

## 2021-12-01 LAB — MICROSCOPIC EXAMINATION: Bacteria, UA: NONE SEEN

## 2021-12-01 LAB — WET PREP FOR TRICH, YEAST, CLUE
Clue Cell Exam: POSITIVE — AB
Trichomonas Exam: NEGATIVE
Yeast Exam: NEGATIVE

## 2021-12-01 LAB — PREGNANCY, URINE: Preg Test, Ur: NEGATIVE

## 2021-12-01 MED ORDER — METHYLPREDNISOLONE 4 MG PO TBPK: ORAL_TABLET | ORAL | 0 refills | Status: DC

## 2021-12-01 MED ORDER — METRONIDAZOLE 500 MG PO TABS: 500.0000 mg | ORAL_TABLET | Freq: Two times a day (BID) | ORAL | 0 refills | Status: AC

## 2021-12-01 MED ORDER — NITROFURANTOIN MONOHYD MACRO 100 MG PO CAPS: 100.0000 mg | ORAL_CAPSULE | Freq: Two times a day (BID) | ORAL | 0 refills | Status: DC

## 2021-12-01 MED ORDER — CYCLOBENZAPRINE HCL 5 MG PO TABS: 5.0000 mg | ORAL_TABLET | Freq: Every day | ORAL | 1 refills | Status: DC

## 2021-12-01 NOTE — Progress Notes (Signed)
BP 115/66   Pulse 60   Temp 98.9 F (37.2 C) (Oral)   Wt 190 lb 1.6 oz (86.2 kg)   LMP 11/01/2021   SpO2 100%   BMI 32.63 kg/m    Subjective:    Patient ID: Kelly Hester, female    DOB: [birth year], 31 y.o.   MRN: [id number]  HPI: Kelly Hester is a 31 y.o. female  Chief Complaint  Patient presents with   Back Pain    Pt states she has been having ongoing back pain since September 9th. States the pain is constant in the middle of her back.    Urinary Tract Infection    Pt states she has also had lower abdominal pain and frequency of urine that started this past Friday.    BACK PAIN Duration: months Mechanism of injury: no trauma Location:  mid back midline Onset: sudden Severity: 5/10 Quality: stabbing Frequency: intermittent Radiation: L leg below the knee Aggravating factors:  standing Alleviating factors:  warm showers help Status: worse Treatments attempted: aleve  Relief with NSAIDs?: no Nighttime pain:  no Paresthesias / decreased sensation:  yes Bowel / bladder incontinence:  no Fevers:  no Dysuria / urinary frequency:  yes  URINARY SYMPTOMS Started last Friday- noticed a white mucousy discharge Dysuria: no Urinary frequency: yes Urgency: no Small volume voids: no Symptom severity: no Urinary incontinence: no Foul odor: yes Hematuria: no Abdominal pain: yes Back pain: yes Suprapubic pain/pressure: yes Flank pain: no Fever:  no Vomiting: no Relief with cranberry juice: no Relief with pyridium: no Status: better/worse/stable Previous urinary tract infection: no Not sexually active   Relevant past medical, surgical, family and social history reviewed and updated as indicated. Interim medical history since our last visit reviewed. Allergies and medications reviewed and updated.  Review of Systems  Constitutional:  Negative for fever.  Gastrointestinal:  Negative for abdominal pain and vomiting.  Genitourinary:  Positive for frequency  and urgency. Negative for decreased urine volume, dysuria, flank pain and hematuria.  Musculoskeletal:  Positive for back pain.    Per HPI unless specifically indicated above     Objective:    BP 115/66   Pulse 60   Temp 98.9 F (37.2 C) (Oral)   Wt 190 lb 1.6 oz (86.2 kg)   LMP 11/01/2021   SpO2 100%   BMI 32.63 kg/m   Wt Readings from Last 3 Encounters:  12/01/21 190 lb 1.6 oz (86.2 kg)  10/06/21 195 lb 11.2 oz (88.8 kg)  07/12/21 198 lb 9.6 oz (90.1 kg)    Physical Exam Vitals and nursing note reviewed.  Constitutional:      General: She is not in acute distress.    Appearance: Normal appearance. She is normal weight. She is not ill-appearing, toxic-appearing or diaphoretic.  HENT:     Head: Normocephalic.     Right Ear: External ear normal.     Left Ear: External ear normal.     Nose: Nose normal.     Mouth/Throat:     Mouth: Mucous membranes are moist.     Pharynx: Oropharynx is clear.  Eyes:     General:        Right eye: No discharge.        Left eye: No discharge.     Extraocular Movements: Extraocular movements intact.     Conjunctiva/sclera: Conjunctivae normal.     Pupils: Pupils are equal, round, and reactive to light.  Cardiovascular:     Rate and Rhythm:  Normal rate and regular rhythm.     Heart sounds: No murmur heard. Pulmonary:     Effort: Pulmonary effort is normal. No respiratory distress.     Breath sounds: Normal breath sounds. No wheezing or rales.  Abdominal:     General: Abdomen is flat. Bowel sounds are normal. There is no distension.     Palpations: Abdomen is soft.     Tenderness: There is abdominal tenderness. There is right CVA tenderness and left CVA tenderness. There is no guarding.  Musculoskeletal:     Cervical back: Normal range of motion and neck supple.       Back:  Skin:    General: Skin is warm and dry.     Capillary Refill: Capillary refill takes less than 2 seconds.  Neurological:     General: No focal deficit  present.     Mental Status: She is alert and oriented to person, place, and time. Mental status is at baseline.  Psychiatric:        Mood and Affect: Mood normal.        Behavior: Behavior normal.        Thought Content: Thought content normal.        Judgment: Judgment normal.     Results for orders placed or performed in visit on 12/01/21  WET PREP FOR [place], YEAST, CLUE   Specimen: Sterile Swab   Sterile Swab  Result Value Ref Range   Trichomonas Exam Negative Negative   Yeast Exam Negative Negative   Clue Cell Exam Positive (A) Negative  Microscopic Examination   Urine  Result Value Ref Range   WBC, UA 0-5 0 - 5 /hpf   RBC, Urine 0-2 0 - 2 /hpf   Epithelial Cells (non renal) 0-10 0 - 10 /hpf   Bacteria, UA None seen None seen/Few  Urinalysis, Routine w reflex microscopic  Result Value Ref Range   Specific Gravity, UA 1.010 1.005 - 1.030   pH, UA 7.0 5.0 - 7.5   Color, UA Yellow Yellow   Appearance Ur Clear Clear   Leukocytes,UA Trace (A) Negative   Protein,UA Negative Negative/Trace   Glucose, UA Negative Negative   Ketones, UA Negative Negative   RBC, UA Negative Negative   Bilirubin, UA Negative Negative   Urobilinogen, Ur 0.2 0.2 - 1.0 mg/dL   Nitrite, UA Negative Negative   Microscopic Examination See below:   Pregnancy, urine  Result Value Ref Range   Preg Test, Ur Negative Negative      Assessment & Plan:   Problem List Items Addressed This Visit   None Visit Diagnoses     Acute cystitis without hematuria    -  Primary   Will treat with macrobid. Complete course of antibiotics. Will send for culture. Increase fluid intake. Follow up if not improved.   Relevant Orders   Urine Culture   Acute midline thoracic back pain       Will treat with medrol dose pak and flexeril. Return to clinic for reevalution if symptoms not improved.    Relevant Medications   methylPREDNISolone (MEDROL DOSEPAK) 4 MG TBPK tablet   cyclobenzaprine (FLEXERIL) 5 MG tablet    Need for influenza vaccination       Relevant Orders   Flu Vaccine QUAD 6+ mos PF IM (Fluarix Quad PF)   Urinary frequency       Relevant Orders   Urinalysis, Routine w reflex microscopic (Completed)   Pregnancy, urine (Completed)   Vaginal discharge  Relevant Orders   WET PREP FOR [place] (Completed)        Follow up plan: Return if symptoms worsen or fail to improve.

## 2021-12-01 NOTE — Addendum Note (Signed)
Addended by: Jon Billings on: 12/01/2021 12:34 PM   Modules accepted: Orders

## 2021-12-01 NOTE — Progress Notes (Signed)
Please let patient know that her lab work shows that she has bacterial vaginosis.  I want her to stop the Macrobid and start Flagyl which I have sent to the pharmacy.  Please also let her know that she should not drink while on the medication. Her pregnancy test was negative.

## 2021-12-02 ENCOUNTER — Ambulatory Visit: Payer: Medicaid Other

## 2021-12-03 LAB — URINE CULTURE

## 2021-12-05 NOTE — Progress Notes (Signed)
Please let patient know that her urine culture was negative.  She does not need further treatment for a UTI.

## 2021-12-08 ENCOUNTER — Telehealth: Payer: Self-pay | Admitting: Nurse Practitioner

## 2021-12-08 NOTE — Telephone Encounter (Signed)
Copied from [place] [phone]. Topic: General - Other >> Dec 08, 2021  4:07 PM Kelly Hester wrote: Reason for CRM: The patient has called to request lab orders for blood work and a pregnancy test  Please contact further when possible

## 2021-12-09 NOTE — Telephone Encounter (Signed)
Contacted patient via [employer]

## 2021-12-13 NOTE — Progress Notes (Unsigned)
   LMP 11/01/2021    Subjective:    Patient ID: Kelly Hester, female    DOB: [birth year], 31 y.o.   MRN: [id number]  HPI: Kelly Hester is a 31 y.o. female  No chief complaint on file.   Relevant past medical, surgical, family and social history reviewed and updated as indicated. Interim medical history since our last visit reviewed. Allergies and medications reviewed and updated.  Review of Systems  Per HPI unless specifically indicated above     Objective:    LMP 11/01/2021   Wt Readings from Last 3 Encounters:  12/01/21 190 lb 1.6 oz (86.2 kg)  10/06/21 195 lb 11.2 oz (88.8 kg)  07/12/21 198 lb 9.6 oz (90.1 kg)    Physical Exam  Results for orders placed or performed in visit on 12/01/21  WET PREP FOR [place], YEAST, CLUE   Specimen: Sterile Swab   Sterile Swab  Result Value Ref Range   Trichomonas Exam Negative Negative   Yeast Exam Negative Negative   Clue Cell Exam Positive (A) Negative  Urine Culture   Specimen: Urine   UR  Result Value Ref Range   Urine Culture, Routine Final report    Organism ID, Bacteria Comment   Microscopic Examination   Urine  Result Value Ref Range   WBC, UA 0-5 0 - 5 /hpf   RBC, Urine 0-2 0 - 2 /hpf   Epithelial Cells (non renal) 0-10 0 - 10 /hpf   Bacteria, UA None seen None seen/Few  Urinalysis, Routine w reflex microscopic  Result Value Ref Range   Specific Gravity, UA 1.010 1.005 - 1.030   pH, UA 7.0 5.0 - 7.5   Color, UA Yellow Yellow   Appearance Ur Clear Clear   Leukocytes,UA Trace (A) Negative   Protein,UA Negative Negative/Trace   Glucose, UA Negative Negative   Ketones, UA Negative Negative   RBC, UA Negative Negative   Bilirubin, UA Negative Negative   Urobilinogen, Ur 0.2 0.2 - 1.0 mg/dL   Nitrite, UA Negative Negative   Microscopic Examination See below:   Pregnancy, urine  Result Value Ref Range   Preg Test, Ur Negative Negative      Assessment & Plan:   Problem List Items Addressed This Visit    None    Follow up plan: No follow-ups on file.

## 2021-12-14 ENCOUNTER — Encounter: Payer: Self-pay | Admitting: Nurse Practitioner

## 2021-12-14 ENCOUNTER — Ambulatory Visit: Payer: Medicaid Other | Admitting: Nurse Practitioner

## 2021-12-14 VITALS — BP 117/80 | HR 67 | Temp 98.4°F | Wt 195.6 lb

## 2021-12-14 DIAGNOSIS — N898 Other specified noninflammatory disorders of vagina: Secondary | ICD-10-CM | POA: Diagnosis not present

## 2021-12-14 DIAGNOSIS — N926 Irregular menstruation, unspecified: Secondary | ICD-10-CM | POA: Insufficient documentation

## 2021-12-14 DIAGNOSIS — D72829 Elevated white blood cell count, unspecified: Secondary | ICD-10-CM | POA: Diagnosis not present

## 2021-12-14 LAB — MICROSCOPIC EXAMINATION: Bacteria, UA: NONE SEEN

## 2021-12-14 LAB — URINALYSIS, ROUTINE W REFLEX MICROSCOPIC
Bilirubin, UA: NEGATIVE
Glucose, UA: NEGATIVE
Ketones, UA: NEGATIVE
Nitrite, UA: NEGATIVE
Protein,UA: NEGATIVE
RBC, UA: NEGATIVE
Specific Gravity, UA: 1.02 (ref 1.005–1.030)
Urobilinogen, Ur: 0.2 mg/dL (ref 0.2–1.0)
pH, UA: 7 (ref 5.0–7.5)

## 2021-12-14 LAB — WET PREP FOR TRICH, YEAST, CLUE
Clue Cell Exam: NEGATIVE
Trichomonas Exam: NEGATIVE
Yeast Exam: NEGATIVE

## 2021-12-14 NOTE — Addendum Note (Signed)
Addended by: Jon Billings on: 12/14/2021 02:39 PM   Modules accepted: Orders

## 2021-12-14 NOTE — Assessment & Plan Note (Signed)
Serum Hcg quant. Drawn in clinic today.  Will update patient on results.

## 2021-12-14 NOTE — Progress Notes (Signed)
Results discussed with patient during visit.

## 2021-12-14 NOTE — Assessment & Plan Note (Addendum)
Vaginal exam and pap completed during this visit.  Labs testing for trichomonas, chlamydia and gonorrhea, bacterial vaginosis (BV) and yeast completed. Negative for yeast. Awaiting results from other tests before recommending treatment. Patient aware and expecting communication through [place].

## 2021-12-15 LAB — HIV ANTIBODY (ROUTINE TESTING W REFLEX): HIV Screen 4th Generation wRfx: NONREACTIVE

## 2021-12-15 LAB — RPR: RPR Ser Ql: NONREACTIVE

## 2021-12-15 LAB — HEPATITIS C ANTIBODY: Hep C Virus Ab: NONREACTIVE

## 2021-12-15 LAB — BETA HCG QUANT (REF LAB): hCG Quant: <1 m[IU]/mL

## 2021-12-15 NOTE — Progress Notes (Signed)
Good Morning.  I am still waiting for some of your lab work however, so far your STI screening is negative.  Also, your pregnancy test was negative.  If the other STI screenings are negative I will treat you with a one time dose of Diflucan for a yeast infection.  Since you used the monistat that may have taken care of the yeast to test.

## 2021-12-16 LAB — URINE CULTURE

## 2021-12-16 LAB — CHLAMYDIA/GONOCOCCUS/TRICHOMONAS, NAA
Chlamydia by NAA: NEGATIVE
Gonococcus by NAA: NEGATIVE
Trich vag by NAA: NEGATIVE

## 2021-12-16 MED ORDER — AMOXICILLIN 500 MG PO CAPS: 500.0000 mg | ORAL_CAPSULE | Freq: Two times a day (BID) | ORAL | 0 refills | Status: AC

## 2021-12-16 NOTE — Progress Notes (Signed)
Please let patient know her urine grew group beta hemolytic strep.  I sent in a prescription to treat this.

## 2021-12-16 NOTE — Addendum Note (Signed)
Addended by: Jon Billings on: 12/16/2021 03:16 PM   Modules accepted: Orders

## 2021-12-27 ENCOUNTER — Ambulatory Visit: Payer: Medicaid Other | Admitting: Nurse Practitioner

## 2022-01-26 NOTE — Progress Notes (Deleted)
There were no vitals taken for this visit.   Subjective:    Patient ID: Kelly Hester, female    DOB: [birth year], 31 y.o.   MRN: [id number]  HPI: Kelly Hester is a 31 y.o. female presenting on 01/30/2022 for comprehensive medical examination. Current medical complaints include:{Blank single:19197::"none","***"}  She currently lives with: Menopausal Symptoms: {Blank single:19197::"yes","no"}  Depression Screen done today and results listed below:     12/14/2021   11:19 AM 03/16/2021    4:06 PM 01/28/2021    8:49 AM 12/28/2020   11:21 AM  Depression screen PHQ 2/9  Decreased Interest 0 1 1 2   Down, Depressed, Hopeless 2 2 1 1   PHQ - 2 Score 2 3 2 3   Altered sleeping 1 2 2 2   Tired, decreased energy 1 2 2 2   Change in appetite 0 2 1 1   Feeling bad or failure about yourself  0 0 0 1  Trouble concentrating 0 0 1 0  Moving slowly or fidgety/restless 0 0 0 1  Suicidal thoughts 0 0 0 0  PHQ-9 Score 4 9 8 10   Difficult doing work/chores Not difficult at all Somewhat difficult Not difficult at all Not difficult at all    The patient {has/does not have:19849} a history of falls. I {did/did not:19850} complete a risk assessment for falls. A plan of care for falls {was/was not:19852} documented.   Past Medical History:  Past Medical History:  Diagnosis Date   No pertinent past medical history     Surgical History:  Past Surgical History:  Procedure Laterality Date   ANKLE SURGERY Left    GALLBLADDER SURGERY      Medications:  Current Outpatient Medications on File Prior to Visit  Medication Sig   nitrofurantoin, macrocrystal-monohydrate, (MACROBID) 100 MG capsule Take 1 capsule (100 mg total) by mouth 2 (two) times daily.   No current facility-administered medications on file prior to visit.    Allergies:  No Known Allergies  Social History:  Social History   Socioeconomic History   Marital status: Divorced    Spouse name: Not on file   Number of children:  5   Years of education: 12   Highest education level: 12th grade  Occupational History   Not on file  Tobacco Use   Smoking status: Never   Smokeless tobacco: Never  Vaping Use   Vaping Use: Former  Substance and Sexual Activity   Alcohol use: Never   Drug use: Never   Sexual activity: Yes  Other Topics Concern   Not on file  Social History Narrative   Not on file   Social Determinants of Health   Financial Resource Strain: Not on file  Food Insecurity: Not on file  Transportation Needs: Not on file  Physical Activity: Not on file  Stress: Not on file  Social Connections: Not on file  Intimate Partner Violence: Not on file   Social History   Tobacco Use  Smoking Status Never  Smokeless Tobacco Never   Social History   Substance and Sexual Activity  Alcohol Use Never    Family History:  Family History  Problem Relation Age of Onset   Healthy Father    Healthy Mother    Cancer Neg Hx    Hypertension Neg Hx    Diabetes Neg Hx     Past medical history, surgical history, medications, allergies, family history and social history reviewed with patient today and changes made to appropriate areas of the chart.  ROS All other ROS negative except what is listed above and in the HPI.      Objective:    There were no vitals taken for this visit.  Wt Readings from Last 3 Encounters:  12/14/21 195 lb 9.6 oz (88.7 kg)  12/01/21 190 lb 1.6 oz (86.2 kg)  10/06/21 195 lb 11.2 oz (88.8 kg)    Physical Exam  Results for orders placed or performed in visit on 12/14/21  WET PREP FOR TRICH, YEAST, CLUE   Specimen: Urine   Urine  Result Value Ref Range   Trichomonas Exam Negative Negative   Yeast Exam Negative Negative   Clue Cell Exam Negative Negative  Chlamydia/Gonococcus/Trichomonas, NAA   Specimen: Urine   UR  Result Value Ref Range   Chlamydia by NAA Negative Negative   Gonococcus by NAA Negative Negative   Trich vag by NAA Negative Negative   Microscopic Examination   Urine  Result Value Ref Range   WBC, UA 0-5 0 - 5 /hpf   RBC, Urine 0-2 0 - 2 /hpf   Epithelial Cells (non renal) 0-10 0 - 10 /hpf   Bacteria, UA None seen None seen/Few  Urine Culture   Specimen: Urine   UR  Result Value Ref Range   Urine Culture, Routine Final report (A)    Organism ID, Bacteria Comment (A)   Urinalysis, Routine w reflex microscopic  Result Value Ref Range   Specific Gravity, UA 1.020 1.005 - 1.030   pH, UA 7.0 5.0 - 7.5   Color, UA Yellow Yellow   Appearance Ur Clear Clear   Leukocytes,UA Trace (A) Negative   Protein,UA Negative Negative/Trace   Glucose, UA Negative Negative   Ketones, UA Negative Negative   RBC, UA Negative Negative   Bilirubin, UA Negative Negative   Urobilinogen, Ur 0.2 0.2 - 1.0 mg/dL   Nitrite, UA Negative Negative   Microscopic Examination See below:   HIV Antibody (routine testing w rflx)  Result Value Ref Range   HIV Screen 4th Generation wRfx Non Reactive Non Reactive  RPR  Result Value Ref Range   RPR Ser Ql Non Reactive Non Reactive  Hepatitis C antibody  Result Value Ref Range   Hep C Virus Ab Non Reactive Non Reactive  Beta hCG quant (ref lab)  Result Value Ref Range   hCG Quant <1 mIU/mL      Assessment & Plan:   Problem List Items Addressed This Visit   None    Follow up plan: No follow-ups on file.   LABORATORY TESTING:  - Pap smear: {Blank single:19197::"pap done","not applicable","up to date","done elsewhere"}  IMMUNIZATIONS:   - Tdap: Tetanus vaccination status reviewed: {tetanus status:315746}. - Influenza: {Blank single:19197::"Up to date","Administered today","Postponed to flu season","Refused","Given elsewhere"} - Pneumovax: {Blank single:19197::"Up to date","Administered today","Not applicable","Refused","Given elsewhere"} - Prevnar: {Blank single:19197::"Up to date","Administered today","Not applicable","Refused","Given elsewhere"} - COVID: {Blank single:19197::"Up to  date","Administered today","Not applicable","Refused","Given elsewhere"} - HPV: {Blank single:19197::"Up to date","Administered today","Not applicable","Refused","Given elsewhere"} - Shingrix vaccine: {Blank single:19197::"Up to date","Administered today","Not applicable","Refused","Given elsewhere"}  SCREENING: -Mammogram: {Blank single:19197::"Up to date","Ordered today","Not applicable","Refused","Done elsewhere"}  - Colonoscopy: {Blank single:19197::"Up to date","Ordered today","Not applicable","Refused","Done elsewhere"}  - Bone Density: {Blank single:19197::"Up to date","Ordered today","Not applicable","Refused","Done elsewhere"}  -Hearing Test: {Blank single:19197::"Up to date","Ordered today","Not applicable","Refused","Done elsewhere"}  -Spirometry: {Blank single:19197::"Up to date","Ordered today","Not applicable","Refused","Done elsewhere"}   PATIENT COUNSELING:   Advised to take 1 mg of folate supplement per day if capable of pregnancy.   Sexuality: Discussed sexually transmitted diseases, partner selection, use  of condoms, avoidance of unintended pregnancy  and contraceptive alternatives.   Advised to avoid cigarette smoking.  I discussed with the patient that most people either abstain from alcohol or drink within safe limits (<=14/week and <=4 drinks/occasion for males, <=7/weeks and <= 3 drinks/occasion for females) and that the risk for alcohol disorders and other health effects rises proportionally with the number of drinks per week and how often a drinker exceeds daily limits.  Discussed cessation/primary prevention of drug use and availability of treatment for abuse.   Diet: Encouraged to adjust caloric intake to maintain  or achieve ideal body weight, to reduce intake of dietary saturated fat and total fat, to limit sodium intake by avoiding high sodium foods and not adding table salt, and to maintain adequate dietary potassium and calcium preferably from fresh fruits,  vegetables, and low-fat dairy products.    stressed the importance of regular exercise  Injury prevention: Discussed safety belts, safety helmets, smoke detector, smoking near bedding or upholstery.   Dental health: Discussed importance of regular tooth brushing, flossing, and dental visits.    NEXT PREVENTATIVE PHYSICAL DUE IN 1 YEAR. No follow-ups on file.

## 2022-01-30 ENCOUNTER — Encounter: Payer: Medicaid Other | Admitting: Nurse Practitioner

## 2022-01-30 DIAGNOSIS — Z136 Encounter for screening for cardiovascular disorders: Secondary | ICD-10-CM

## 2022-01-30 DIAGNOSIS — Z0000 Encounter for general adult medical examination without abnormal findings: Secondary | ICD-10-CM

## 2022-03-02 NOTE — Progress Notes (Deleted)
There were no vitals taken for this visit.   Subjective:    Patient ID: Kelly Hester, female    DOB: Aug 16, 1990, 31 y.o.   MRN: [id number]  HPI: Kelly Hester is a 31 y.o. female presenting on 03/03/2022 for comprehensive medical examination. Current medical complaints include:{Blank single:19197::"none","***"}  She currently lives with: Menopausal Symptoms: {Blank single:19197::"yes","no"}  Depression Screen done today and results listed below:     12/14/2021   11:19 AM 03/16/2021    4:06 PM 01/28/2021    8:49 AM 12/28/2020   11:21 AM  Depression screen PHQ 2/9  Decreased Interest 0 1 1 2   Down, Depressed, Hopeless 2 2 1 1   PHQ - 2 Score 2 3 2 3   Altered sleeping 1 2 2 2   Tired, decreased energy 1 2 2 2   Change in appetite 0 2 1 1   Feeling bad or failure about yourself  0 0 0 1  Trouble concentrating 0 0 1 0  Moving slowly or fidgety/restless 0 0 0 1  Suicidal thoughts 0 0 0 0  PHQ-9 Score 4 9 8 10   Difficult doing work/chores Not difficult at all Somewhat difficult Not difficult at all Not difficult at all    The patient {has/does not have:19849} a history of falls. I {did/did not:19850} complete a risk assessment for falls. A plan of care for falls {was/was not:19852} documented.   Past Medical History:  Past Medical History:  Diagnosis Date   No pertinent past medical history     Surgical History:  Past Surgical History:  Procedure Laterality Date   ANKLE SURGERY Left    GALLBLADDER SURGERY      Medications:  Current Outpatient Medications on File Prior to Visit  Medication Sig   nitrofurantoin, macrocrystal-monohydrate, (MACROBID) 100 MG capsule Take 1 capsule (100 mg total) by mouth 2 (two) times daily.   No current facility-administered medications on file prior to visit.    Allergies:  No Known Allergies  Social History:  Social History   Socioeconomic History   Marital status: Divorced    Spouse name: Not on file   Number of children:  5   Years of education: 12   Highest education level: 12th grade  Occupational History   Not on file  Tobacco Use   Smoking status: Never   Smokeless tobacco: Never  Vaping Use   Vaping Use: Former  Substance and Sexual Activity   Alcohol use: Never   Drug use: Never   Sexual activity: Yes  Other Topics Concern   Not on file  Social History Narrative   Not on file   Social Determinants of Health   Financial Resource Strain: Not on file  Food Insecurity: Not on file  Transportation Needs: Not on file  Physical Activity: Not on file  Stress: Not on file  Social Connections: Not on file  Intimate Partner Violence: Not on file   Social History   Tobacco Use  Smoking Status Never  Smokeless Tobacco Never   Social History   Substance and Sexual Activity  Alcohol Use Never    Family History:  Family History  Problem Relation Age of Onset   Healthy Father    Healthy Mother    Cancer Neg Hx    Hypertension Neg Hx    Diabetes Neg Hx     Past medical history, surgical history, medications, allergies, family history and social history reviewed with patient today and changes made to appropriate areas of the chart.  ROS All other ROS negative except what is listed above and in the HPI.      Objective:    There were no vitals taken for this visit.  Wt Readings from Last 3 Encounters:  12/14/21 195 lb 9.6 oz (88.7 kg)  12/01/21 190 lb 1.6 oz (86.2 kg)  10/06/21 195 lb 11.2 oz (88.8 kg)    Physical Exam  Results for orders placed or performed in visit on 12/14/21  WET PREP FOR TRICH, YEAST, CLUE   Specimen: Urine   Urine  Result Value Ref Range   Trichomonas Exam Negative Negative   Yeast Exam Negative Negative   Clue Cell Exam Negative Negative  Chlamydia/Gonococcus/Trichomonas, NAA   Specimen: Urine   UR  Result Value Ref Range   Chlamydia by NAA Negative Negative   Gonococcus by NAA Negative Negative   Trich vag by NAA Negative Negative   Microscopic Examination   Urine  Result Value Ref Range   WBC, UA 0-5 0 - 5 /hpf   RBC, Urine 0-2 0 - 2 /hpf   Epithelial Cells (non renal) 0-10 0 - 10 /hpf   Bacteria, UA None seen None seen/Few  Urine Culture   Specimen: Urine   UR  Result Value Ref Range   Urine Culture, Routine Final report (A)    Organism ID, Bacteria Comment (A)   Urinalysis, Routine w reflex microscopic  Result Value Ref Range   Specific Gravity, UA 1.020 1.005 - 1.030   pH, UA 7.0 5.0 - 7.5   Color, UA Yellow Yellow   Appearance Ur Clear Clear   Leukocytes,UA Trace (A) Negative   Protein,UA Negative Negative/Trace   Glucose, UA Negative Negative   Ketones, UA Negative Negative   RBC, UA Negative Negative   Bilirubin, UA Negative Negative   Urobilinogen, Ur 0.2 0.2 - 1.0 mg/dL   Nitrite, UA Negative Negative   Microscopic Examination See below:   HIV Antibody (routine testing w rflx)  Result Value Ref Range   HIV Screen 4th Generation wRfx Non Reactive Non Reactive  RPR  Result Value Ref Range   RPR Ser Ql Non Reactive Non Reactive  Hepatitis C antibody  Result Value Ref Range   Hep C Virus Ab Non Reactive Non Reactive  Beta hCG quant (ref lab)  Result Value Ref Range   hCG Quant <1 mIU/mL      Assessment & Plan:   Problem List Items Addressed This Visit   None    Follow up plan: No follow-ups on file.   LABORATORY TESTING:  - Pap smear: {Blank single:19197::"pap done","not applicable","up to date","done elsewhere"}  IMMUNIZATIONS:   - Tdap: Tetanus vaccination status reviewed: {tetanus status:315746}. - Influenza: {Blank single:19197::"Up to date","Administered today","Postponed to flu season","Refused","Given elsewhere"} - Pneumovax: {Blank single:19197::"Up to date","Administered today","Not applicable","Refused","Given elsewhere"} - Prevnar: {Blank single:19197::"Up to date","Administered today","Not applicable","Refused","Given elsewhere"} - COVID: {Blank single:19197::"Up to  date","Administered today","Not applicable","Refused","Given elsewhere"} - HPV: {Blank single:19197::"Up to date","Administered today","Not applicable","Refused","Given elsewhere"} - Shingrix vaccine: {Blank single:19197::"Up to date","Administered today","Not applicable","Refused","Given elsewhere"}  SCREENING: -Mammogram: {Blank single:19197::"Up to date","Ordered today","Not applicable","Refused","Done elsewhere"}  - Colonoscopy: {Blank single:19197::"Up to date","Ordered today","Not applicable","Refused","Done elsewhere"}  - Bone Density: {Blank single:19197::"Up to date","Ordered today","Not applicable","Refused","Done elsewhere"}  -Hearing Test: {Blank single:19197::"Up to date","Ordered today","Not applicable","Refused","Done elsewhere"}  -Spirometry: {Blank single:19197::"Up to date","Ordered today","Not applicable","Refused","Done elsewhere"}   PATIENT COUNSELING:   Advised to take 1 mg of folate supplement per day if capable of pregnancy.   Sexuality: Discussed sexually transmitted diseases, partner selection, use  of condoms, avoidance of unintended pregnancy  and contraceptive alternatives.   Advised to avoid cigarette smoking.  I discussed with the patient that most people either abstain from alcohol or drink within safe limits (<=14/week and <=4 drinks/occasion for males, <=7/weeks and <= 3 drinks/occasion for females) and that the risk for alcohol disorders and other health effects rises proportionally with the number of drinks per week and how often a drinker exceeds daily limits.  Discussed cessation/primary prevention of drug use and availability of treatment for abuse.   Diet: Encouraged to adjust caloric intake to maintain  or achieve ideal body weight, to reduce intake of dietary saturated fat and total fat, to limit sodium intake by avoiding high sodium foods and not adding table salt, and to maintain adequate dietary potassium and calcium preferably from fresh fruits,  vegetables, and low-fat dairy products.    stressed the importance of regular exercise  Injury prevention: Discussed safety belts, safety helmets, smoke detector, smoking near bedding or upholstery.   Dental health: Discussed importance of regular tooth brushing, flossing, and dental visits.    NEXT PREVENTATIVE PHYSICAL DUE IN 1 YEAR. No follow-ups on file.

## 2022-03-03 ENCOUNTER — Encounter: Payer: Medicaid Other | Admitting: Nurse Practitioner

## 2022-03-03 DIAGNOSIS — Z0000 Encounter for general adult medical examination without abnormal findings: Secondary | ICD-10-CM

## 2022-03-03 DIAGNOSIS — Z136 Encounter for screening for cardiovascular disorders: Secondary | ICD-10-CM

## 2022-07-19 ENCOUNTER — Emergency Department: Payer: 59

## 2022-07-19 ENCOUNTER — Other Ambulatory Visit: Payer: Self-pay

## 2022-07-19 ENCOUNTER — Emergency Department
Admission: EM | Admit: 2022-07-19 | Discharge: 2022-07-19 | Disposition: A | Discharge status: Home / Self Care | Payer: 59 | Attending: Emergency Medicine | Admitting: Emergency Medicine

## 2022-07-19 DIAGNOSIS — T07XXXA Unspecified multiple injuries, initial encounter: Secondary | ICD-10-CM

## 2022-07-19 DIAGNOSIS — R404 Transient alteration of awareness: Secondary | ICD-10-CM | POA: Diagnosis not present

## 2022-07-19 DIAGNOSIS — Y92007 Garden or yard of unspecified non-institutional (private) residence as the place of occurrence of the external cause: Secondary | ICD-10-CM | POA: Insufficient documentation

## 2022-07-19 DIAGNOSIS — R609 Edema, unspecified: Secondary | ICD-10-CM | POA: Diagnosis not present

## 2022-07-19 DIAGNOSIS — S0083XA Contusion of other part of head, initial encounter: Secondary | ICD-10-CM | POA: Insufficient documentation

## 2022-07-19 DIAGNOSIS — Y908 Blood alcohol level of 240 mg/100 ml or more: Secondary | ICD-10-CM | POA: Insufficient documentation

## 2022-07-19 DIAGNOSIS — R000 Tachycardia, unspecified: Secondary | ICD-10-CM | POA: Diagnosis not present

## 2022-07-19 DIAGNOSIS — R451 Restlessness and agitation: Secondary | ICD-10-CM | POA: Diagnosis not present

## 2022-07-19 DIAGNOSIS — F10920 Alcohol use, unspecified with intoxication, uncomplicated: Secondary | ICD-10-CM

## 2022-07-19 DIAGNOSIS — Z041 Encounter for examination and observation following transport accident: Secondary | ICD-10-CM | POA: Diagnosis not present

## 2022-07-19 DIAGNOSIS — F10129 Alcohol abuse with intoxication, unspecified: Secondary | ICD-10-CM | POA: Insufficient documentation

## 2022-07-19 DIAGNOSIS — R456 Violent behavior: Secondary | ICD-10-CM | POA: Diagnosis not present

## 2022-07-19 DIAGNOSIS — T148XXA Other injury of unspecified body region, initial encounter: Secondary | ICD-10-CM | POA: Insufficient documentation

## 2022-07-19 DIAGNOSIS — F10929 Alcohol use, unspecified with intoxication, unspecified: Secondary | ICD-10-CM | POA: Diagnosis not present

## 2022-07-19 LAB — COMPREHENSIVE METABOLIC PANEL
ALT: 17 U/L (ref 0–44)
AST: 24 U/L (ref 15–41)
Albumin: 4.6 g/dL (ref 3.5–5.0)
Alkaline Phosphatase: 115 U/L (ref 38–126)
Anion gap: 9 (ref 5–15)
BUN: 10 mg/dL (ref 6–20)
CO2: 23 mmol/L (ref 22–32)
Calcium: 9.2 mg/dL (ref 8.9–10.3)
Chloride: 110 mmol/L (ref 98–111)
Creatinine, Ser: 0.87 mg/dL (ref 0.44–1.00)
GFR, Estimated: >60 mL/min (ref >60)
Glucose, Bld: 111 mg/dL — ABNORMAL HIGH (ref 70–99)
Potassium: 3.7 mmol/L (ref 3.5–5.1)
Sodium: 142 mmol/L (ref 135–145)
Total Bilirubin: 0.5 mg/dL (ref 0.3–1.2)
Total Protein: 8.4 g/dL — ABNORMAL HIGH (ref 6.5–8.1)

## 2022-07-19 LAB — CBC
HCT: 40.7 % (ref 36.0–46.0)
Hemoglobin: 13.2 g/dL (ref 12.0–15.0)
MCH: 27.3 pg (ref 26.0–34.0)
MCHC: 32.4 g/dL (ref 30.0–36.0)
MCV: 84.3 fL (ref 80.0–100.0)
Platelets: 258 10*3/uL (ref 150–400)
RBC: 4.83 MIL/uL (ref 3.87–5.11)
RDW: 14.4 % (ref 11.5–15.5)
WBC: 10.2 10*3/uL (ref 4.0–10.5)
nRBC: 0 % (ref 0.0–0.2)

## 2022-07-19 LAB — URINALYSIS, ROUTINE W REFLEX MICROSCOPIC
Bilirubin Urine: NEGATIVE
Glucose, UA: NEGATIVE mg/dL
Hgb urine dipstick: NEGATIVE
Ketones, ur: NEGATIVE mg/dL
Leukocytes,Ua: NEGATIVE
Nitrite: NEGATIVE
Protein, ur: NEGATIVE mg/dL
Specific Gravity, Urine: 1.003 — ABNORMAL LOW (ref 1.005–1.030)
pH: 6 (ref 5.0–8.0)

## 2022-07-19 LAB — URINE DRUG SCREEN, QUALITATIVE (ARMC ONLY)
Amphetamines, Ur Screen: NOT DETECTED
Barbiturates, Ur Screen: NOT DETECTED
Benzodiazepine, Ur Scrn: NOT DETECTED
Cannabinoid 50 Ng, Ur ~~LOC~~: NOT DETECTED
Cocaine Metabolite,Ur ~~LOC~~: NOT DETECTED
MDMA (Ecstasy)Ur Screen: NOT DETECTED
Methadone Scn, Ur: NOT DETECTED
Opiate, Ur Screen: NOT DETECTED
Phencyclidine (PCP) Ur S: NOT DETECTED
Tricyclic, Ur Screen: NOT DETECTED

## 2022-07-19 LAB — LIPASE, BLOOD: Lipase: 39 U/L (ref 11–51)

## 2022-07-19 LAB — POC URINE PREG, ED: Preg Test, Ur: NEGATIVE

## 2022-07-19 LAB — ETHANOL: Alcohol, Ethyl (B): 317 mg/dL (ref <10)

## 2022-07-19 MED ORDER — DIPHENHYDRAMINE HCL 50 MG/ML IJ SOLN
INTRAMUSCULAR | Status: AC
  Administered 2022-07-19: 25 mg via INTRAVENOUS
  Filled 2022-07-19: qty 1

## 2022-07-19 MED ORDER — SODIUM CHLORIDE 0.9 % IV BOLUS
1000.0000 mL | Freq: Once | INTRAVENOUS | Status: AC
  Administered 2022-07-19: 1000 mL via INTRAVENOUS

## 2022-07-19 MED ORDER — DIPHENHYDRAMINE HCL 50 MG/ML IJ SOLN: 25.0000 mg | Freq: Once | INTRAMUSCULAR | Status: AC

## 2022-07-19 MED ORDER — HALOPERIDOL LACTATE 5 MG/ML IJ SOLN
INTRAMUSCULAR | Status: AC
  Administered 2022-07-19: 5 mg via INTRAVENOUS
  Filled 2022-07-19: qty 1

## 2022-07-19 MED ORDER — LORAZEPAM 2 MG/ML PO CONC: 1.0000 mg | Freq: Once | ORAL | Status: DC

## 2022-07-19 MED ORDER — LORAZEPAM 2 MG/ML IJ SOLN
INTRAMUSCULAR | Status: AC
  Administered 2022-07-19: 1 mg via INTRAVENOUS
  Filled 2022-07-19: qty 1

## 2022-07-19 MED ORDER — ONDANSETRON HCL 4 MG/2ML IJ SOLN
4.0000 mg | Freq: Once | INTRAMUSCULAR | Status: AC
  Administered 2022-07-19: 4 mg via INTRAVENOUS
  Filled 2022-07-19: qty 2

## 2022-07-19 MED ORDER — IOHEXOL 300 MG/ML  SOLN
100.0000 mL | Freq: Once | INTRAMUSCULAR | Status: AC | PRN
  Administered 2022-07-19: 100 mL via INTRAVENOUS

## 2022-07-19 MED ORDER — HALOPERIDOL LACTATE 5 MG/ML IJ SOLN: 5.0000 mg | Freq: Once | INTRAMUSCULAR | Status: AC

## 2022-07-19 MED ORDER — LORAZEPAM 2 MG/ML IJ SOLN: 1.0000 mg | Freq: Once | INTRAMUSCULAR | Status: AC

## 2022-07-19 NOTE — ED Notes (Signed)
Patient's belongings inventoried with Erie Noe, RN and [place], RN:  2 Black Socks 1 Black Shirt 1 Carney Bern Pant 1 Blue and pink Bra 2 White Shoes One pair of underwear
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
## 2022-07-19 NOTE — ED Notes (Signed)
Dr. Dolores Frame notified of ETOH of 317.

## 2022-07-19 NOTE — ED Provider Notes (Signed)
On reexamination patient has no pain, no abdominal tenderness to palpation at all.  She is appropriate for discharge, her father is here   Jene Every, MD 07/19/22 1051

## 2022-07-19 NOTE — ED Notes (Signed)
Pt awake and alert, father here to pick up pt.
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
## 2022-10-28 DIAGNOSIS — N390 Urinary tract infection, site not specified: Secondary | ICD-10-CM | POA: Diagnosis not present

## 2023-02-27 ENCOUNTER — Other Ambulatory Visit: Payer: Self-pay

## 2023-02-27 DIAGNOSIS — N390 Urinary tract infection, site not specified: Secondary | ICD-10-CM | POA: Insufficient documentation

## 2023-02-27 DIAGNOSIS — N760 Acute vaginitis: Secondary | ICD-10-CM | POA: Diagnosis not present

## 2023-02-27 DIAGNOSIS — E876 Hypokalemia: Secondary | ICD-10-CM | POA: Diagnosis not present

## 2023-02-27 DIAGNOSIS — R102 Pelvic and perineal pain: Secondary | ICD-10-CM | POA: Diagnosis not present

## 2023-02-27 LAB — POC URINE PREG, ED: Preg Test, Ur: NEGATIVE

## 2023-02-27 LAB — URINALYSIS, ROUTINE W REFLEX MICROSCOPIC
Bilirubin Urine: NEGATIVE
Glucose, UA: NEGATIVE mg/dL
Hgb urine dipstick: NEGATIVE
Ketones, ur: NEGATIVE mg/dL
Nitrite: NEGATIVE
Protein, ur: NEGATIVE mg/dL
Specific Gravity, Urine: 1.024 (ref 1.005–1.030)
pH: 5 (ref 5.0–8.0)

## 2023-02-27 LAB — COMPREHENSIVE METABOLIC PANEL
ALT: 18 U/L (ref 0–44)
AST: 21 U/L (ref 15–41)
Albumin: 4.2 g/dL (ref 3.5–5.0)
Alkaline Phosphatase: 87 U/L (ref 38–126)
Anion gap: 10 (ref 5–15)
BUN: 16 mg/dL (ref 6–20)
CO2: 23 mmol/L (ref 22–32)
Calcium: 9.1 mg/dL (ref 8.9–10.3)
Chloride: 103 mmol/L (ref 98–111)
Creatinine, Ser: 0.62 mg/dL (ref 0.44–1.00)
GFR, Estimated: >60 mL/min (ref >60)
Glucose, Bld: 109 mg/dL — ABNORMAL HIGH (ref 70–99)
Potassium: 3.3 mmol/L — ABNORMAL LOW (ref 3.5–5.1)
Sodium: 136 mmol/L (ref 135–145)
Total Bilirubin: 0.6 mg/dL (ref <1.2)
Total Protein: 8.1 g/dL (ref 6.5–8.1)

## 2023-02-27 LAB — CBC WITH DIFFERENTIAL/PLATELET
Abs Immature Granulocytes: 0.02 10*3/uL (ref 0.00–0.07)
Basophils Absolute: 0 10*3/uL (ref 0.0–0.1)
Basophils Relative: 0 %
Eosinophils Absolute: 0.2 10*3/uL (ref 0.0–0.5)
Eosinophils Relative: 2 %
HCT: 37 % (ref 36.0–46.0)
Hemoglobin: 12.2 g/dL (ref 12.0–15.0)
Immature Granulocytes: 0 %
Lymphocytes Relative: 30 %
Lymphs Abs: 2.6 10*3/uL (ref 0.7–4.0)
MCH: 27.3 pg (ref 26.0–34.0)
MCHC: 33 g/dL (ref 30.0–36.0)
MCV: 82.8 fL (ref 80.0–100.0)
Monocytes Absolute: 0.5 10*3/uL (ref 0.1–1.0)
Monocytes Relative: 5 %
Neutro Abs: 5.2 10*3/uL (ref 1.7–7.7)
Neutrophils Relative %: 63 %
Platelets: 249 10*3/uL (ref 150–400)
RBC: 4.47 MIL/uL (ref 3.87–5.11)
RDW: 15.6 % — ABNORMAL HIGH (ref 11.5–15.5)
WBC: 8.4 10*3/uL (ref 4.0–10.5)
nRBC: 0 % (ref 0.0–0.2)

## 2023-02-27 LAB — LIPASE, BLOOD: Lipase: 32 U/L (ref 11–51)

## 2023-02-27 NOTE — ED Triage Notes (Addendum)
Pt reports she has not had a period in 3 months, pt denies any vaginal discharge. States she wants to be checked for infection. Pt denies dysuria. Pt reports lower abd pain

## 2023-02-28 ENCOUNTER — Emergency Department
Admission: EM | Admit: 2023-02-28 | Discharge: 2023-02-28 | Disposition: A | Discharge status: Home / Self Care | Payer: 59 | Attending: Emergency Medicine | Admitting: Emergency Medicine

## 2023-02-28 ENCOUNTER — Emergency Department: Payer: 59

## 2023-02-28 DIAGNOSIS — N760 Acute vaginitis: Secondary | ICD-10-CM

## 2023-02-28 DIAGNOSIS — N390 Urinary tract infection, site not specified: Secondary | ICD-10-CM | POA: Diagnosis not present

## 2023-02-28 DIAGNOSIS — R102 Pelvic and perineal pain: Secondary | ICD-10-CM

## 2023-02-28 DIAGNOSIS — E876 Hypokalemia: Secondary | ICD-10-CM

## 2023-02-28 LAB — WET PREP, GENITAL
Sperm: NONE SEEN
Trich, Wet Prep: NONE SEEN
WBC, Wet Prep HPF POC: <10 (ref <10)
Yeast Wet Prep HPF POC: NONE SEEN

## 2023-02-28 LAB — CHLAMYDIA/NGC RT PCR (ARMC ONLY)
Chlamydia Tr: NOT DETECTED
N gonorrhoeae: NOT DETECTED

## 2023-02-28 MED ORDER — METRONIDAZOLE 500 MG PO TABS: 500.0000 mg | ORAL_TABLET | Freq: Two times a day (BID) | ORAL | 0 refills | Status: DC

## 2023-02-28 MED ORDER — FOSFOMYCIN TROMETHAMINE 3 G PO PACK
3.0000 g | PACK | Freq: Once | ORAL | Status: AC
  Administered 2023-02-28: 3 g via ORAL
  Filled 2023-02-28: qty 3

## 2023-02-28 MED ORDER — POTASSIUM CHLORIDE CRYS ER 20 MEQ PO TBCR
40.0000 meq | EXTENDED_RELEASE_TABLET | Freq: Once | ORAL | Status: AC
  Administered 2023-02-28: 40 meq via ORAL
  Filled 2023-02-28: qty 2

## 2023-02-28 MED ORDER — KETOROLAC TROMETHAMINE 60 MG/2ML IM SOLN
30.0000 mg | Freq: Once | INTRAMUSCULAR | Status: AC
  Administered 2023-02-28: 30 mg via INTRAMUSCULAR
  Filled 2023-02-28: qty 2

## 2023-02-28 NOTE — ED Provider Notes (Signed)
[facility] Provider Note    Event Date/Time   First MD Initiated Contact with Patient 02/28/23 [phone]     (approximate)   History   Abdominal Pain   HPI  History obtained via tele-sign language interpreter  Kelly Hester is a 32 y.o. female who presents to the ED from home with a chief complaint of pelvic pain times several days.  States she has not had a period since October.  Denies associated fever/chills, chest pain, shortness of breath, nausea/vomiting, or vaginal discharge/bleeding.  Feels like she may have a UTI.  Denies STD concerns but wants to be checked for infection.     Past Medical History   Past Medical History:  Diagnosis Date   No pertinent past medical history      Active Problem List   Patient Active Problem List   Diagnosis Date Noted   Vaginal discharge 12/14/2021   Missed period 12/14/2021     Past Surgical History   Past Surgical History:  Procedure Laterality Date   ANKLE SURGERY Left    GALLBLADDER SURGERY       Home Medications   Prior to Admission medications   Medication Sig Start Date End Date Taking? Authorizing Provider  metroNIDAZOLE (FLAGYL) 500 MG tablet Take 1 tablet (500 mg total) by mouth 2 (two) times daily. 02/28/23  Yes Irean Hong, MD  nitrofurantoin, macrocrystal-monohydrate, (MACROBID) 100 MG capsule Take 1 capsule (100 mg total) by mouth 2 (two) times daily. 12/01/21   Larae Grooms, NP     Allergies  Patient has no known allergies.   Family History   Family History  Problem Relation Age of Onset   Healthy Father    Healthy Mother    Cancer Neg Hx    Hypertension Neg Hx    Diabetes Neg Hx      Physical Exam  Triage Vital Signs: ED Triage Vitals  Encounter Vitals Group     BP 02/27/23 2108 126/74     Systolic BP Percentile --      Diastolic BP Percentile --      Pulse Rate 02/27/23 2107 72     Resp 02/27/23 2107 18     Temp 02/27/23 2107 98.6 F (37 C)     Temp  src --      SpO2 02/27/23 2107 99 %     Weight 02/27/23 2107 170 lb (77.1 kg)     Height 02/27/23 2107 5\' 4"  (1.626 m)     Head Circumference --      Peak Flow --      Pain Score 02/27/23 2107 6     Pain Loc --      Pain Education --      Exclude from Growth Chart --     Updated Vital Signs: BP 126/74   Pulse 72   Temp 98.6 F (37 C)   Resp 18   Ht 5\' 4"  (1.626 m)   Wt 77.1 kg   SpO2 99%   BMI 29.18 kg/m    General: Awake, no distress.  Signing to friend via cell phone. CV:  RRR.  Good peripheral perfusion.  Resp:  Normal effort.  CTAB. Abd:  Nontender.  No distention.  Other:  No truncal vesicles.   ED Results / Procedures / Treatments  Labs (all labs ordered are listed, but only abnormal results are displayed) Labs Reviewed  WET PREP, GENITAL - Abnormal; Notable for the following components:  Result Value   Clue Cells Wet Prep HPF POC PRESENT (*)    All other components within normal limits  CBC WITH DIFFERENTIAL/PLATELET - Abnormal; Notable for the following components:   RDW 15.6 (*)    All other components within normal limits  COMPREHENSIVE METABOLIC PANEL - Abnormal; Notable for the following components:   Potassium 3.3 (*)    Glucose, Bld 109 (*)    All other components within normal limits  URINALYSIS, ROUTINE W REFLEX MICROSCOPIC - Abnormal; Notable for the following components:   Color, Urine YELLOW (*)    APPearance HAZY (*)    Leukocytes,Ua TRACE (*)    Bacteria, UA RARE (*)    All other components within normal limits  CHLAMYDIA/NGC RT PCR ([facility] ONLY)            LIPASE, BLOOD  POC URINE PREG, ED     EKG  None   RADIOLOGY I have independently visualized and interpreted patient's imaging study as well as noted the radiology interpretation:  Ultrasound: Unremarkable  Official radiology report(s): US PELVIC COMPLETE WITH TRANSVAGINAL Result Date: 02/28/2023 CLINICAL DATA:  Vaginal bleeding and lower abdominal pain. EXAM:  TRANSABDOMINAL AND TRANSVAGINAL ULTRASOUND OF PELVIS TECHNIQUE: Both transabdominal and transvaginal ultrasound examinations of the pelvis were performed. Transabdominal technique was performed for global imaging of the pelvis including uterus, ovaries, adnexal regions, and pelvic cul-de-sac. It was necessary to proceed with endovaginal exam following the transabdominal exam to visualize the ovaries and endometrium. COMPARISON:  CT chest abdomen and pelvis 07/19/2022 FINDINGS: Uterus Measurements: 8.1 x 5.0 x 5.7 cm = volume: 121 mL. No fibroids or other mass visualized. Endometrium Thickness: 6.6 mm.  No focal abnormality visualized. Right ovary Measurements: 2.7 x 2.0 x 2.1 cm = volume: 6 mL. Normal appearance/no adnexal mass. Left ovary Not visualized. Other findings No abnormal free fluid. IMPRESSION: 1. The endometrium measures 6.6 mm which is within normal limits for a premenopausal patient. 2. The right ovary is unremarkable. The left ovary is not visualized. Electronically Signed   By: Darliss Cheney M.D.   On: 02/28/2023 03:10     PROCEDURES:  Critical Care performed: No  Procedures   MEDICATIONS ORDERED IN ED: Medications  ketorolac (TORADOL) injection 30 mg (30 mg Intramuscular Given 02/28/23 0109)  potassium chloride SA (KLOR-CON M) CR tablet 40 mEq (40 mEq Oral Given 02/28/23 0108)  fosfomycin (MONUROL) packet 3 g (3 g Oral Given 02/28/23 0110)     IMPRESSION / MDM / ASSESSMENT AND PLAN / ED COURSE  I reviewed the triage vital signs and the nursing notes.                             31 year old female presenting with pelvic/lower abdominal pain, amenorrhea. Differential diagnosis includes, but is not limited to, ovarian cyst, ovarian torsion, acute appendicitis, diverticulitis, urinary tract infection/pyelonephritis, endometriosis, bowel obstruction, colitis, renal colic, gastroenteritis, hernia, fibroids, endometriosis, pregnancy related pain including ectopic pregnancy, etc. I  personally reviewed patient's records and note a PCP office visit from 12/14/2021 for vaginal discharge.  Patient's presentation is most consistent with acute complicated illness / injury requiring diagnostic workup.  Laboratory results demonstrate mild hypokalemia with potassium 3.3, normal WBC, trace leukocytes in urine.  Will replete potassium, administer fosfomycin.  Patient will self swab for STI and we will obtain pelvic ultrasound.  Will reassess.  Clinical Course as of 02/28/23 0321  Wed Feb 28, 2023  1191 Pelvic ultrasound  unremarkable.  Will treat BV.  Patient will follow-up with gynecology.  Strict return precautions given.  Patient verbalizes understanding and agrees with plan of care. [JS]    Clinical Course User Index [JS] Irean Hong, MD     FINAL CLINICAL IMPRESSION(S) / ED DIAGNOSES   Final diagnoses:  Lower urinary tract infectious disease  Pelvic pain  Hypokalemia  Bacterial vaginosis     Rx / DC Orders   ED Discharge Orders          Ordered    metroNIDAZOLE (FLAGYL) 500 MG tablet  2 times daily        02/28/23 0314             Note:  This document was prepared using Dragon voice recognition software and may include unintentional dictation errors.   Irean Hong, MD 02/28/23 [phone]

## 2023-02-28 NOTE — Discharge Instructions (Signed)
Take and finish antibiotic as prescribed.  You may take Tylenol and/or Ibuprofen as needed for pain.  Return to the ER for worsening symptoms, persistent vomiting, difficulty breathing or other concerns.

## 2024-03-11 ENCOUNTER — Ambulatory Visit (INDEPENDENT_AMBULATORY_CARE_PROVIDER_SITE_OTHER): Admitting: Nurse Practitioner

## 2024-03-11 ENCOUNTER — Encounter: Payer: Self-pay | Admitting: Nurse Practitioner

## 2024-03-11 VITALS — BP 128/81 | HR 67 | Temp 98.0°F | Ht 64.02 in | Wt 167.8 lb

## 2024-03-11 DIAGNOSIS — Z23 Encounter for immunization: Secondary | ICD-10-CM | POA: Diagnosis not present

## 2024-03-11 DIAGNOSIS — Z0000 Encounter for general adult medical examination without abnormal findings: Secondary | ICD-10-CM

## 2024-03-11 DIAGNOSIS — Z136 Encounter for screening for cardiovascular disorders: Secondary | ICD-10-CM

## 2024-03-11 NOTE — Progress Notes (Signed)
 "  BP 128/81 (BP Location: Right Arm, Patient Position: Sitting, Cuff Size: Normal)   Pulse 67   Temp 98 F (36.7 C) (Oral)   Ht 5' 4.02 (1.626 m)   Wt 167 lb 12.8 oz (76.1 kg)   LMP 03/02/2024 (Exact Date)   SpO2 97%   BMI 28.79 kg/m    Subjective:    Patient ID: Kelly Hester, female    DOB: May 27, 1990, 33 y.o.   MRN: [id number]  HPI: Kelly Hester is a 33 y.o. female presenting on 03/11/2024 for comprehensive medical examination. Current medical complaints include:none  She currently lives with: Menopausal Symptoms: no  Patient states she has had some clammy hands and feet lately.  Her hair has been falling out when she is showering.  She reports a bald spot on the top of her head.    Depression Screen done today and results listed below:     03/11/2024    3:32 PM 12/14/2021   11:19 AM 03/16/2021    4:06 PM 01/28/2021    8:49 AM 12/28/2020   11:21 AM  Depression screen PHQ 2/9  Decreased Interest 1 0 1 1 2   Down, Depressed, Hopeless 2 2 2 1 1   PHQ - 2 Score 3 2 3 2 3   Altered sleeping 1 1 2 2 2   Tired, decreased energy 2 1 2 2 2   Change in appetite 0 0 2 1 1   Feeling bad or failure about yourself  0 0 0 0 1  Trouble concentrating 0 0 0 1 0  Moving slowly or fidgety/restless 0 0 0 0 1  Suicidal thoughts 0 0 0 0 0  PHQ-9 Score 6 4  9  8  10    Difficult doing work/chores Not difficult at all Not difficult at all Somewhat difficult Not difficult at all Not difficult at all     Data saved with a previous flowsheet row definition    The patient does not have a history of falls. I did complete a risk assessment for falls. A plan of care for falls was documented.   Past Medical History:  Past Medical History:  Diagnosis Date   No pertinent past medical history     Surgical History:  Past Surgical History:  Procedure Laterality Date   ANKLE SURGERY Left    GALLBLADDER SURGERY      Medications:  No current outpatient medications on file prior to visit.    No current facility-administered medications on file prior to visit.    Allergies:  Allergies[1]  Social History:  Social History   Socioeconomic History   Marital status: Divorced    Spouse name: Not on file   Number of children: 5   Years of education: 12   Highest education level: 12th grade  Occupational History   Not on file  Tobacco Use   Smoking status: Never   Smokeless tobacco: Never  Vaping Use   Vaping status: Some Days   Substances: Flavoring  Substance and Sexual Activity   Alcohol use: Never   Drug use: Never   Sexual activity: Yes  Other Topics Concern   Not on file  Social History Narrative   Not on file   Social Drivers of Health   Tobacco Use: Low Risk (03/11/2024)   Patient History    Smoking Tobacco Use: Never    Smokeless Tobacco Use: Never    Passive Exposure: Not on file  Financial Resource Strain: Not on file  Food Insecurity: Not on file  Transportation Needs: Not on file  Physical Activity: Not on file  Stress: Not on file  Social Connections: Not on file  Intimate Partner Violence: Not on file  Depression (PHQ2-9): Medium Risk (03/11/2024)   Depression (PHQ2-9)    PHQ-2 Score: 6  Alcohol Screen: Not on file  Housing: Not on file  Utilities: Not on file  Health Literacy: Not on file   Tobacco Use History[2] Social History   Substance and Sexual Activity  Alcohol Use Never    Family History:  Family History  Problem Relation Age of Onset   Healthy Father    Healthy Mother    Cancer Neg Hx    Hypertension Neg Hx    Diabetes Neg Hx     Past medical history, surgical history, medications, allergies, family history and social history reviewed with patient today and changes made to appropriate areas of the chart.   Review of Systems  Constitutional:        Clammy hands and hair loss   All other ROS negative except what is listed above and in the HPI.      Objective:    BP 128/81 (BP Location: Right Arm, Patient  Position: Sitting, Cuff Size: Normal)   Pulse 67   Temp 98 F (36.7 C) (Oral)   Ht 5' 4.02 (1.626 m)   Wt 167 lb 12.8 oz (76.1 kg)   LMP 03/02/2024 (Exact Date)   SpO2 97%   BMI 28.79 kg/m   Wt Readings from Last 3 Encounters:  03/11/24 167 lb 12.8 oz (76.1 kg)  02/27/23 170 lb (77.1 kg)  07/19/22 207 lb 3.7 oz (94 kg)    Physical Exam Vitals and nursing note reviewed. Exam conducted with a chaperone present Bonney Metro, CMA).  Constitutional:      General: She is awake. She is not in acute distress.    Appearance: She is well-developed. She is not ill-appearing.  HENT:     Head: Normocephalic and atraumatic.     Right Ear: Hearing, tympanic membrane, ear canal and external ear normal. No drainage.     Left Ear: Hearing, tympanic membrane, ear canal and external ear normal. No drainage.     Nose: Nose normal.     Right Sinus: No maxillary sinus tenderness or frontal sinus tenderness.     Left Sinus: No maxillary sinus tenderness or frontal sinus tenderness.     Mouth/Throat:     Mouth: Mucous membranes are moist.     Pharynx: Oropharynx is clear. Uvula midline. No pharyngeal swelling, oropharyngeal exudate or posterior oropharyngeal erythema.  Eyes:     General: Lids are normal.        Right eye: No discharge.        Left eye: No discharge.     Extraocular Movements: Extraocular movements intact.     Conjunctiva/sclera: Conjunctivae normal.     Pupils: Pupils are equal, round, and reactive to light.     Visual Fields: Right eye visual fields normal and left eye visual fields normal.  Neck:     Thyroid: No thyromegaly.     Vascular: No carotid bruit.     Trachea: Trachea normal.  Cardiovascular:     Rate and Rhythm: Normal rate and regular rhythm.     Heart sounds: Normal heart sounds. No murmur heard.    No gallop.  Pulmonary:     Effort: Pulmonary effort is normal. No accessory muscle usage or respiratory distress.     Breath sounds: Normal  breath sounds.   Chest:  Breasts:    Right: Normal.     Left: Normal.  Abdominal:     General: Bowel sounds are normal.     Palpations: Abdomen is soft. There is no hepatomegaly or splenomegaly.     Tenderness: There is no abdominal tenderness.  Musculoskeletal:        General: Normal range of motion.     Cervical back: Normal range of motion and neck supple.     Right lower leg: No edema.     Left lower leg: No edema.  Lymphadenopathy:     Head:     Right side of head: No submental, submandibular, tonsillar, preauricular or posterior auricular adenopathy.     Left side of head: No submental, submandibular, tonsillar, preauricular or posterior auricular adenopathy.     Cervical: No cervical adenopathy.     Upper Body:     Right upper body: No supraclavicular, axillary or pectoral adenopathy.     Left upper body: No supraclavicular, axillary or pectoral adenopathy.  Skin:    General: Skin is warm and dry.     Capillary Refill: Capillary refill takes less than 2 seconds.     Findings: No rash.  Neurological:     Mental Status: She is alert and oriented to person, place, and time.     Gait: Gait is intact.  Psychiatric:        Attention and Perception: Attention normal.        Mood and Affect: Mood normal.        Speech: Speech normal.        Behavior: Behavior normal. Behavior is cooperative.        Thought Content: Thought content normal.        Judgment: Judgment normal.     Results for orders placed or performed during the hospital encounter of 02/28/23  CBC with Differential   Collection Time: 02/27/23  9:09 PM  Result Value Ref Range   WBC 8.4 4.0 - 10.5 K/uL   RBC 4.47 3.87 - 5.11 MIL/uL   Hemoglobin 12.2 12.0 - 15.0 g/dL   HCT 62.9 63.9 - 53.9 %   MCV 82.8 80.0 - 100.0 fL   MCH 27.3 26.0 - 34.0 pg   MCHC 33.0 30.0 - 36.0 g/dL   RDW 84.3 (H) 88.4 - 84.4 %   Platelets 249 150 - 400 K/uL   nRBC 0.0 0.0 - 0.2 %   Neutrophils Relative % 63 %   Neutro Abs 5.2 1.7 - 7.7 K/uL    Lymphocytes Relative 30 %   Lymphs Abs 2.6 0.7 - 4.0 K/uL   Monocytes Relative 5 %   Monocytes Absolute 0.5 0.1 - 1.0 K/uL   Eosinophils Relative 2 %   Eosinophils Absolute 0.2 0.0 - 0.5 K/uL   Basophils Relative 0 %   Basophils Absolute 0.0 0.0 - 0.1 K/uL   Immature Granulocytes 0 %   Abs Immature Granulocytes 0.02 0.00 - 0.07 K/uL  Lipase, blood   Collection Time: 02/27/23  9:09 PM  Result Value Ref Range   Lipase 32 11 - 51 U/L  Comprehensive metabolic panel   Collection Time: 02/27/23  9:09 PM  Result Value Ref Range   Sodium 136 135 - 145 mmol/L   Potassium 3.3 (L) 3.5 - 5.1 mmol/L   Chloride 103 98 - 111 mmol/L   CO2 23 22 - 32 mmol/L   Glucose, Bld 109 (H) 70 - 99 mg/dL  BUN 16 6 - 20 mg/dL   Creatinine, Ser 9.37 0.44 - 1.00 mg/dL   Calcium 9.1 8.9 - 89.6 mg/dL   Total Protein 8.1 6.5 - 8.1 g/dL   Albumin 4.2 3.5 - 5.0 g/dL   AST 21 15 - 41 U/L   ALT 18 0 - 44 U/L   Alkaline Phosphatase 87 38 - 126 U/L   Total Bilirubin 0.6 <1.2 mg/dL   GFR, Estimated >39 >39 mL/min   Anion gap 10 5 - 15  Urinalysis, Routine w reflex microscopic -Urine, Clean Catch   Collection Time: 02/27/23  9:09 PM  Result Value Ref Range   Color, Urine YELLOW (A) YELLOW   APPearance HAZY (A) CLEAR   Specific Gravity, Urine 1.024 1.005 - 1.030   pH 5.0 5.0 - 8.0   Glucose, UA NEGATIVE NEGATIVE mg/dL   Hgb urine dipstick NEGATIVE NEGATIVE   Bilirubin Urine NEGATIVE NEGATIVE   Ketones, ur NEGATIVE NEGATIVE mg/dL   Protein, ur NEGATIVE NEGATIVE mg/dL   Nitrite NEGATIVE NEGATIVE   Leukocytes,Ua TRACE (A) NEGATIVE   RBC / HPF 0-5 0 - 5 RBC/hpf   WBC, UA 11-20 0 - 5 WBC/hpf   Bacteria, UA RARE (A) NONE SEEN   Squamous Epithelial / HPF 6-10 0 - 5 /HPF   Mucus PRESENT   POC urine preg, ED   Collection Time: 02/27/23  9:11 PM  Result Value Ref Range   Preg Test, Ur NEGATIVE NEGATIVE  Chlamydia/NGC rt PCR ([facility] only)   Collection Time: 02/28/23  1:49 AM   Specimen: Cervical/Vaginal swab   Result Value Ref Range   Specimen source GC/Chlam ENDOCERVICAL    Chlamydia Tr NOT DETECTED NOT DETECTED   N gonorrhoeae NOT DETECTED NOT DETECTED  Wet prep, genital   Collection Time: 02/28/23  1:49 AM   Specimen: Cervical/Vaginal swab  Result Value Ref Range   Yeast Wet Prep HPF POC NONE SEEN NONE SEEN   Trich, Wet Prep NONE SEEN NONE SEEN   Clue Cells Wet Prep HPF POC PRESENT (A) NONE SEEN   WBC, Wet Prep HPF POC <10 <10   Sperm NONE SEEN       Assessment & Plan:   Problem List Items Addressed This Visit   None Visit Diagnoses       Annual physical exam    -  Primary   Health maintenance reviewed during visit today.  Labs ordered.  Vaccines reviewed.  PAP up to date.   Relevant Orders   CBC with Differential/Platelet   Comprehensive metabolic panel with GFR   Lipid panel   TSH     Screening for ischemic heart disease       Relevant Orders   Lipid panel     Flu vaccine need       Relevant Orders   Flu vaccine trivalent PF, 6mos and older(Flulaval,Afluria,Fluarix,Fluzone) (Completed)     Need for COVID-19 vaccine       Relevant Orders   Pfizer Comirnaty Covid-19 Vaccine 48yrs & older (Completed)        Follow up plan: No follow-ups on file.   LABORATORY TESTING:  - Pap smear: up to date  IMMUNIZATIONS:   - Tdap: Tetanus vaccination status reviewed: last tetanus booster within 10 years. - Influenza: Administered today - Pneumovax: Not applicable - Prevnar: Not applicable - COVID: Administered today - HPV: Not applicable - Shingrix vaccine: Not applicable  SCREENING: -Mammogram: Not applicable  - Colonoscopy: Not applicable  - Bone Density: Not applicable  -  Hearing Test: Not applicable  -Spirometry: Not applicable   PATIENT COUNSELING:   Advised to take 1 mg of folate supplement per day if capable of pregnancy.   Sexuality: Discussed sexually transmitted diseases, partner selection, use of condoms, avoidance of unintended pregnancy  and  contraceptive alternatives.   Advised to avoid cigarette smoking.  I discussed with the patient that most people either abstain from alcohol or drink within safe limits (<=14/week and <=4 drinks/occasion for males, <=7/weeks and <= 3 drinks/occasion for females) and that the risk for alcohol disorders and other health effects rises proportionally with the number of drinks per week and how often a drinker exceeds daily limits.  Discussed cessation/primary prevention of drug use and availability of treatment for abuse.   Diet: Encouraged to adjust caloric intake to maintain  or achieve ideal body weight, to reduce intake of dietary saturated fat and total fat, to limit sodium intake by avoiding high sodium foods and not adding table salt, and to maintain adequate dietary potassium and calcium preferably from fresh fruits, vegetables, and low-fat dairy products.    stressed the importance of regular exercise  Injury prevention: Discussed safety belts, safety helmets, smoke detector, smoking near bedding or upholstery.   Dental health: Discussed importance of regular tooth brushing, flossing, and dental visits.    NEXT PREVENTATIVE PHYSICAL DUE IN 1 YEAR. No follow-ups on file.             [1] No Known Allergies [2]  Social History Tobacco Use  Smoking Status Never  Smokeless Tobacco Never   "

## 2024-03-12 ENCOUNTER — Ambulatory Visit: Payer: Self-pay | Admitting: Nurse Practitioner

## 2024-03-12 LAB — CBC WITH DIFFERENTIAL/PLATELET
Basophils Absolute: 0 x10E3/uL (ref 0.0–0.2)
Basos: 0 %
EOS (ABSOLUTE): 0.1 x10E3/uL (ref 0.0–0.4)
Eos: 2 %
Hematocrit: 38.6 % (ref 34.0–46.6)
Hemoglobin: 12.6 g/dL (ref 11.1–15.9)
Immature Grans (Abs): 0 x10E3/uL (ref 0.0–0.1)
Immature Granulocytes: 0 %
Lymphocytes Absolute: 2.4 x10E3/uL (ref 0.7–3.1)
Lymphs: 35 %
MCH: 28.4 pg (ref 26.6–33.0)
MCHC: 32.6 g/dL (ref 31.5–35.7)
MCV: 87 fL (ref 79–97)
Monocytes Absolute: 0.5 x10E3/uL (ref 0.1–0.9)
Monocytes: 7 %
Neutrophils Absolute: 3.9 x10E3/uL (ref 1.4–7.0)
Neutrophils: 56 %
Platelets: 256 x10E3/uL (ref 150–450)
RBC: 4.43 x10E6/uL (ref 3.77–5.28)
RDW: 15.5 % — ABNORMAL HIGH (ref 11.7–15.4)
WBC: 7 x10E3/uL (ref 3.4–10.8)

## 2024-03-12 LAB — COMPREHENSIVE METABOLIC PANEL WITH GFR
ALT: 12 IU/L (ref 0–32)
AST: 15 IU/L (ref 0–40)
Albumin: 4.6 g/dL (ref 3.9–4.9)
Alkaline Phosphatase: 110 IU/L (ref 41–116)
BUN/Creatinine Ratio: 33 — ABNORMAL HIGH (ref 9–23)
BUN: 17 mg/dL (ref 6–20)
Bilirubin Total: 0.5 mg/dL (ref 0.0–1.2)
CO2: 25 mmol/L (ref 20–29)
Calcium: 9.5 mg/dL (ref 8.7–10.2)
Chloride: 99 mmol/L (ref 96–106)
Creatinine, Ser: 0.52 mg/dL — ABNORMAL LOW (ref 0.57–1.00)
Globulin, Total: 2.7 g/dL (ref 1.5–4.5)
Glucose: 78 mg/dL (ref 70–99)
Potassium: 4 mmol/L (ref 3.5–5.2)
Sodium: 137 mmol/L (ref 134–144)
Total Protein: 7.3 g/dL (ref 6.0–8.5)
eGFR: 124 mL/min/1.73

## 2024-03-12 LAB — TSH: TSH: 0.818 u[IU]/mL (ref 0.450–4.500)

## 2024-03-12 LAB — LIPID PANEL
Chol/HDL Ratio: 3.9 ratio (ref 0.0–4.4)
Cholesterol, Total: 194 mg/dL (ref 100–199)
HDL: 50 mg/dL
LDL Chol Calc (NIH): 118 mg/dL — ABNORMAL HIGH (ref 0–99)
Triglycerides: 147 mg/dL (ref 0–149)
VLDL Cholesterol Cal: 26 mg/dL (ref 5–40)

## 2025-03-12 ENCOUNTER — Encounter: Admitting: Nurse Practitioner
